# Patient Record
Sex: Male | Born: 1940 | Race: White | Hispanic: No | Marital: Single | Smoking: Former smoker
Health system: Southern US, Community
[De-identification: ages and names within clinical notes are randomized; demographics above are authoritative.]

## PROBLEM LIST (undated history)

## (undated) DIAGNOSIS — E119 Type 2 diabetes mellitus without complications: Secondary | ICD-10-CM

## (undated) DIAGNOSIS — I4891 Unspecified atrial fibrillation: Secondary | ICD-10-CM

## (undated) DIAGNOSIS — G5600 Carpal tunnel syndrome, unspecified upper limb: Secondary | ICD-10-CM

## (undated) DIAGNOSIS — E782 Mixed hyperlipidemia: Secondary | ICD-10-CM

## (undated) DIAGNOSIS — I2510 Atherosclerotic heart disease of native coronary artery without angina pectoris: Secondary | ICD-10-CM

## (undated) DIAGNOSIS — I10 Essential (primary) hypertension: Secondary | ICD-10-CM

## (undated) DIAGNOSIS — I255 Ischemic cardiomyopathy: Secondary | ICD-10-CM

## (undated) HISTORY — DX: Atherosclerotic heart disease of native coronary artery without angina pectoris: I25.10

## (undated) HISTORY — DX: Unspecified atrial fibrillation: I48.91

## (undated) HISTORY — DX: Mixed hyperlipidemia: E78.2

## (undated) HISTORY — DX: Essential (primary) hypertension: I10

## (undated) HISTORY — DX: Carpal tunnel syndrome, unspecified upper limb: G56.00

## (undated) HISTORY — DX: Ischemic cardiomyopathy: I25.5

## (undated) HISTORY — DX: Type 2 diabetes mellitus without complications: E11.9

---

## 2014-08-04 HISTORY — PX: CHOLECYSTECTOMY: SHX55

## 2014-08-31 ENCOUNTER — Encounter: Payer: Self-pay | Admitting: Cardiology

## 2014-08-31 ENCOUNTER — Encounter: Payer: Self-pay | Admitting: *Deleted

## 2014-08-31 ENCOUNTER — Ambulatory Visit (INDEPENDENT_AMBULATORY_CARE_PROVIDER_SITE_OTHER): Payer: Medicare HMO | Admitting: Cardiology

## 2014-08-31 VITALS — BP 163/70 | HR 63 | Ht 68.0 in | Wt 176.8 lb

## 2014-08-31 DIAGNOSIS — I481 Persistent atrial fibrillation: Secondary | ICD-10-CM

## 2014-08-31 DIAGNOSIS — I4819 Other persistent atrial fibrillation: Secondary | ICD-10-CM

## 2014-08-31 DIAGNOSIS — I10 Essential (primary) hypertension: Secondary | ICD-10-CM | POA: Diagnosis not present

## 2014-08-31 DIAGNOSIS — E119 Type 2 diabetes mellitus without complications: Secondary | ICD-10-CM | POA: Diagnosis not present

## 2014-08-31 DIAGNOSIS — I4891 Unspecified atrial fibrillation: Secondary | ICD-10-CM

## 2014-08-31 LAB — PROTIME-INR

## 2014-08-31 MED ORDER — ASPIRIN 325 MG PO TABS: 325.0000 mg | ORAL_TABLET | Freq: Every day | ORAL | Status: DC

## 2014-08-31 NOTE — Patient Instructions (Addendum)
Your physician has recommended you make the following change in your medication:  Decrease your aspirin 325 mg to daily. Continue all other medications the same. Your physician recommends that you have lab work to check your CMET, CBC, PTT, PT/INR.  Your physician has requested that you have an echocardiogram. Echocardiography is a painless test that uses sound waves to create images of your heart. It provides your doctor with information about the size and shape of your heart and how well your heart's chambers and valves are working. This procedure takes approximately one hour. There are no restrictions for this procedure. Your physician recommends that you schedule a follow-up appointment in: 1 month.

## 2014-08-31 NOTE — Progress Notes (Signed)
Cardiology Office Note  Date: 08/31/2014   ID: ERCOLE Sullivan, DOB Apr 04, 1940, MRN [id number]  PCP: Glenda Chroman., MD  Consulting Cardiologist: Rozann Lesches, MD   Chief Complaint  Patient presents with  . Atrial Fibrillation    History of Present Illness: Patrick Sullivan is a 74 y.o. male referred for cardiology consultation by Dr. Woody Seller. Limited records indicate evaluation in July for cholecystectomy, noted to be in atrial fibrillation by preoperative ECG. No reported cardiac complications with procedure, now referred to discuss management of atrial fibrillation.  He is here today, accompanied for his visit. He does not endorse any chest pain, limiting shortness of breath, or sense of palpitations. Reportedly, prior ECG from 2009 showed sinus rhythm, but no interval ECGs are available for comparison. Duration of atrial fibrillation is uncertain.  CHADSVASC score is 4, annual stroke risk of 5.1% on aspirin versus 1.7% on Eliquis.we discussed this in detail today. He has been taking aspirin 325 mg, 2 tablets each day for several years. Does report easy bleeding/bruising. No history of GI bleeding except reportedly years ago when he was drinking alcohol heavily back in the 1980s.  Today we discussed rationale for anticoagulation for stroke prophylaxis. He did not want to make a decision about this as yet. Also discussed obtaining an echocardiogram to assess cardiac structure and function, as well as lab work to assess renal function, hepatic function, and blood counts.  Past Medical History  Diagnosis Date  . Mixed hyperlipidemia   . Essential hypertension   . Type 2 diabetes mellitus     Past Surgical History  Procedure Laterality Date  . Cholecystectomy  08/04/2014    Current Outpatient Prescriptions  Medication Sig Dispense Refill  . amLODipine (NORVASC) 5 MG tablet Take 5 mg by mouth daily.    Marland Kitchen aspirin 325 MG tablet Take 1 tablet (325 mg total) by mouth daily. 30  tablet 3  . Blood Glucose Monitoring Suppl ([place]) KIT by Does not apply route.    . Garlic 8675 MG TABS Take 1 tablet by mouth daily.    Marland Kitchen gemfibrozil (LOPID) 600 MG tablet Take 600 mg by mouth 2 (two) times daily before a meal.    . GLIPIZIDE ER PO Take 5 mg by mouth daily. noon    . Insulin Pen Needle 32G X 6 MM MISC by Does not apply route.    . lovastatin (MEVACOR) 20 MG tablet Take 20 mg by mouth every evening.     . Multiple Vitamin (MULTIVITAMIN) tablet Take 0.5 tablets by mouth 2 (two) times daily.     . pantoprazole (PROTONIX) 40 MG tablet Take 40 mg by mouth daily.    . saw palmetto 500 MG capsule Take 100 mg by mouth daily.     . sitaGLIPtin-metformin (JANUMET) 50-1000 MG per tablet Take 1 tablet by mouth 2 (two) times daily with a meal.     No current facility-administered medications for this visit.    Allergies:  Review of patient's allergies indicates no known allergies.   Social History: The patient  reports that he quit smoking about 30 years ago. His smoking use included Cigarettes. He does not have any smokeless tobacco history on file. He reports that he does not drink alcohol or use illicit drugs.   Family History: The patient's family history includes Bone cancer in his son; Heart attack in his father.   ROS:  Please see the history of present illness. Otherwise, complete review of systems  is positive for chronic back pain.  All other systems are reviewed and negative.   Physical Exam: VS:  BP 163/70 mmHg  Pulse 63  Ht 5' 8" (1.727 m)  Wt 176 lb 12.8 oz (80.196 kg)  BMI 26.89 kg/m2  SpO2 100%, BMI Body mass index is 26.89 kg/(m^2).  Wt Readings from Last 3 Encounters:  08/31/14 176 lb 12.8 oz (80.196 kg)  08/17/14 181 lb (82.101 kg)     General: Patient appears comfortable at rest. HEENT: Conjunctiva and lids normal, oropharynx clear. Neck: Supple, no elevated JVP or carotid bruits, no thyromegaly. Lungs: Clear to auscultation, nonlabored  breathing at rest. Cardiac: Irregularly irregular, no S3 or significant systolic murmur, no pericardial rub. Abdomen: Soft, nontender, healing laparoscopy incisions, bowel sounds present, no guarding or rebound. Extremities: No pitting edema, distal pulses 2+. Skin: Warm and dry. Musculoskeletal: No kyphosis. Neuropsychiatric: Alert and oriented x3, affect grossly appropriate.   ECG: Tracing from 08/17/2014 done at [facility] showed atrial fibrillation at 68 bpm. Repeat tracing today confirms atrial fibrillation at 63 bpm.   Assessment and Plan:  1. Persistent atrial fibrillation of uncertain duration, asymptomatic in terms of palpitations, not requiring any specific rate control medication. CHADSVASC score is 4, we did discuss anticoagulation for stroke prophylaxis, however he did not want to make any decisions about this as yet. He will continue aspirin, I did ask him to reduce the dose to 81 mg daily. Echocardiogram will be obtained to assess cardiac structure and function, we will also review lab work.  2. Essential hypertension, on Norvasc, followed by Dr. Woody Seller.  3. Type 2 diabetes mellitus, followed by Dr. Woody Seller.  Current medicines were reviewed with the patient today.   Orders Placed This Encounter  Procedures  . Protime-INR  . APTT  . Comprehensive metabolic panel  . CBC  . EKG 12-Lead    Disposition: FU with me in 1 month.   Signed, Satira Sark, MD, [facility] 08/31/2014 3:10 PM    [facility] [place], [place], [place] [address] Phone: [phone]; Fax: [phone]

## 2014-09-03 ENCOUNTER — Telehealth: Payer: Self-pay | Admitting: *Deleted

## 2014-09-03 NOTE — Telephone Encounter (Signed)
-----   Message from Albertine Patricia, CMA sent at 09/01/2014  1:04 PM EDT -----   ----- Message -----    From: Jonelle Sidle, MD    Sent: 09/01/2014  12:09 PM      To: Albertine Patricia, CMA  Reviewed. Creatinine normal at 1.0, potassium 5.3, normal LFTs, normal INR, hemoglobin 10.9, platelets 183. Can discuss further office follow-up regarding potential medication changes.

## 2014-09-03 NOTE — Telephone Encounter (Signed)
Returned call and gave new phone number for patient. [phone].

## 2014-09-08 ENCOUNTER — Encounter: Payer: Self-pay | Admitting: *Deleted

## 2014-09-08 NOTE — Telephone Encounter (Signed)
-----   Message from Staci T Ashworth, CMA sent at 09/01/2014  1:04 PM EDT -----   ----- Message -----    From: Samuel G McDowell, MD    Sent: 09/01/2014  12:09 PM      To: Staci T Ashworth, CMA  Reviewed. Creatinine normal at 1.0, potassium 5.3, normal LFTs, normal INR, hemoglobin 10.9, platelets 183. Can discuss further office follow-up regarding potential medication changes. 

## 2014-09-08 NOTE — Telephone Encounter (Signed)
Results mailed to patient;.

## 2014-09-17 ENCOUNTER — Other Ambulatory Visit: Payer: Self-pay

## 2014-09-17 ENCOUNTER — Ambulatory Visit (INDEPENDENT_AMBULATORY_CARE_PROVIDER_SITE_OTHER): Payer: Medicare HMO

## 2014-09-17 DIAGNOSIS — I481 Persistent atrial fibrillation: Secondary | ICD-10-CM | POA: Diagnosis not present

## 2014-09-17 DIAGNOSIS — I4819 Other persistent atrial fibrillation: Secondary | ICD-10-CM

## 2014-09-23 ENCOUNTER — Telehealth: Payer: Self-pay | Admitting: *Deleted

## 2014-09-23 NOTE — Telephone Encounter (Signed)
Patient informed. 

## 2014-09-23 NOTE — Telephone Encounter (Signed)
-----   Message from Jonelle Sidle, MD sent at 09/18/2014 10:46 AM EDT ----- Reviewed report. LVEF normal range at 60-65%, mild mitral regurgitation, mild to moderate left atrial enlargement. No change in plan for now, will discuss further office follow-up.

## 2014-10-01 ENCOUNTER — Ambulatory Visit (INDEPENDENT_AMBULATORY_CARE_PROVIDER_SITE_OTHER): Payer: Medicare HMO | Admitting: Cardiology

## 2014-10-01 ENCOUNTER — Encounter: Payer: Self-pay | Admitting: Cardiology

## 2014-10-01 VITALS — BP 146/73 | HR 71 | Ht 68.0 in | Wt 176.8 lb

## 2014-10-01 DIAGNOSIS — I10 Essential (primary) hypertension: Secondary | ICD-10-CM | POA: Diagnosis not present

## 2014-10-01 DIAGNOSIS — I481 Persistent atrial fibrillation: Secondary | ICD-10-CM

## 2014-10-01 DIAGNOSIS — I4819 Other persistent atrial fibrillation: Secondary | ICD-10-CM

## 2014-10-01 NOTE — Progress Notes (Signed)
Cardiology Office Note  Date: 10/01/2014   ID: Patrick Sullivan, DOB Nov 15, 1940, MRN [id number]  PCP: Patrick Sullivan., MD  Primary Cardiologist: Patrick Lesches, MD   Chief Complaint  Patient presents with  . Persistent atrial fibrillation    History of Present Illness: Patrick Sullivan is a 74 y.o. male seen recently in consultation back in early August with persistent atrial fibrillation of uncertain duration. He comes in today for a follow-up visit. We reviewed the results of his interval echocardiogram, outlined below. He states that he has considered the possibility of anticoagulation further, and declines at this point. He would prefer to stay on aspirin at this time. I reminded him that with CHADSVASC score is 4, his annual stroke risk is approximately 5.1% on aspirin versus 1.7% on Eliquis.  He states he remains active, experiences no palpitations or chest pain. Does chores indoors and outdoors.  Past Medical History  Diagnosis Date  . Mixed hyperlipidemia   . Essential hypertension   . Type 2 diabetes mellitus     Current Outpatient Prescriptions  Medication Sig Dispense Refill  . amLODipine (NORVASC) 5 MG tablet Take 5 mg by mouth daily.    Marland Kitchen aspirin 325 MG tablet Take 1 tablet (325 mg total) by mouth daily. 30 tablet 3  . Blood Glucose Monitoring Suppl ([place]) KIT by Does not apply route.    . Garlic 7408 MG TABS Take 1 tablet by mouth daily.    Marland Kitchen gemfibrozil (LOPID) 600 MG tablet Take 600 mg by mouth 2 (two) times daily before a meal.    . GLIPIZIDE ER PO Take 5 mg by mouth daily. noon    . Insulin Pen Needle 32G X 6 MM MISC by Does not apply route.    . lovastatin (MEVACOR) 20 MG tablet Take 20 mg by mouth every evening.     . Multiple Vitamin (MULTIVITAMIN) tablet Take 0.5 tablets by mouth 2 (two) times daily.     . pantoprazole (PROTONIX) 40 MG tablet Take 40 mg by mouth daily.    . saw palmetto 500 MG capsule Take 100 mg by mouth daily.     .  sitaGLIPtin-metformin (JANUMET) 50-1000 MG per tablet Take 1 tablet by mouth 2 (two) times daily with a meal.     No current facility-administered medications for this visit.    Allergies:  Review of patient's allergies indicates no known allergies.   Social History: The patient  reports that he quit smoking about 30 years ago. His smoking use included Cigarettes. He does not have any smokeless tobacco history on file. He reports that he does not drink alcohol or use illicit drugs.   ROS:  Please see the history of present illness. Otherwise, complete review of systems is positive for none.  All other systems are reviewed and negative.   Physical Exam: VS:  BP 146/73 mmHg  Pulse 71  Ht _0  (1.727 m)  Wt 176 lb 12.8 oz (80.196 kg)  BMI 26.89 kg/m2  SpO2 98%, BMI Body mass index is 26.89 kg/(m^2).  Wt Readings from Last 3 Encounters:  10/01/14 176 lb 12.8 oz (80.196 kg)  08/31/14 176 lb 12.8 oz (80.196 kg)  08/17/14 181 lb (82.101 kg)     General: Patient appears comfortable at rest. HEENT: Conjunctiva and lids normal, oropharynx clear with moist mucosa. Neck: Supple, no elevated JVP or carotid bruits, no thyromegaly. Lungs: Clear to auscultation, nonlabored breathing at rest. Cardiac: Regular rate and rhythm,  no S3 or significant systolic murmur, no pericardial rub. Abdomen: Soft, nontender, no hepatomegaly, bowel sounds present, no guarding or rebound. Extremities: No pitting edema, distal pulses 2+.   ECG: ECG is not ordered today.   Recent Labwork:  08/31/2014: BUN 26, creatinine 1.0, potassium 5.3, INR 1.1, hemoglobin 10.9, platelet 183.  Other Studies Reviewed Today:  Echocardiogram 09/17/2014: Study Conclusions  - Left ventricle: The cavity size was normal. Wall thickness was normal. Systolic function was normal. The estimated ejection fraction was in the range of 60% to 65%. Wall motion was normal; there were no regional wall motion abnormalities. The study  was not technically sufficient to allow evaluation of LV diastolic dysfunction due to atrial fibrillation. - Aortic valve: Mildly to moderately calcified annulus. Mildly calcified leaflets. - Mitral valve: There was mild regurgitation. - Left atrium: The atrium was mildly to moderately dilated.  Assessment and Plan:  1. Persistent atrial fibrillation as outlined above. We have had discussion about anticoagulation for stroke prophylaxis, and he declines. Continues on aspirin daily, no rate lowering medications at this point.  2. Essential hypertension, on Norvasc, keep follow-up with Patrick Sullivan.  Current medicines were reviewed with the patient today.  Disposition: FU with me in 6 months.   Signed, Patrick Sark, MD, Patrick Sullivan 10/01/2014 1:24 PM    [facility] [place], [place], [place] [address] Phone: [phone]; Fax: [phone]

## 2014-10-01 NOTE — Patient Instructions (Signed)
Your physician recommends that you continue on your current medications as directed. Please refer to the Current Medication list given to you today. Your physician recommends that you schedule a follow-up appointment in: 6 months. You will receive a reminder letter in the mail in about 4 months reminding you to call and schedule your appointment. If you don't receive this letter, please contact our office. 

## 2015-02-03 ENCOUNTER — Other Ambulatory Visit: Payer: Self-pay | Admitting: Cardiology

## 2015-02-03 ENCOUNTER — Ambulatory Visit (INDEPENDENT_AMBULATORY_CARE_PROVIDER_SITE_OTHER): Payer: Medicare HMO | Admitting: Cardiology

## 2015-02-03 ENCOUNTER — Encounter: Payer: Self-pay | Admitting: Cardiology

## 2015-02-03 VITALS — BP 150/66 | HR 82 | Ht 68.0 in | Wt 183.0 lb

## 2015-02-03 DIAGNOSIS — I25709 Atherosclerosis of coronary artery bypass graft(s), unspecified, with unspecified angina pectoris: Secondary | ICD-10-CM

## 2015-02-03 DIAGNOSIS — R778 Other specified abnormalities of plasma proteins: Secondary | ICD-10-CM

## 2015-02-03 DIAGNOSIS — I481 Persistent atrial fibrillation: Secondary | ICD-10-CM

## 2015-02-03 DIAGNOSIS — E119 Type 2 diabetes mellitus without complications: Secondary | ICD-10-CM

## 2015-02-03 DIAGNOSIS — Z01811 Encounter for preprocedural respiratory examination: Secondary | ICD-10-CM | POA: Diagnosis not present

## 2015-02-03 DIAGNOSIS — I10 Essential (primary) hypertension: Secondary | ICD-10-CM

## 2015-02-03 DIAGNOSIS — I4819 Other persistent atrial fibrillation: Secondary | ICD-10-CM

## 2015-02-03 DIAGNOSIS — R0602 Shortness of breath: Secondary | ICD-10-CM | POA: Diagnosis not present

## 2015-02-03 DIAGNOSIS — R7989 Other specified abnormal findings of blood chemistry: Secondary | ICD-10-CM

## 2015-02-03 NOTE — Addendum Note (Signed)
Addended by: Marlyn CorporalARLTON, CATHERINE A on: 02/03/2015 10:11 AM   Modules accepted: Orders

## 2015-02-03 NOTE — Progress Notes (Signed)
Cardiology Office Note  Date: 02/03/2015   ID: Patrick Sullivan, DOB 22-Sep-1940, MRN [id number]  PCP: Glenda Chroman., MD  Primary Cardiologist: Rozann Lesches, MD   Chief Complaint  Patient presents with  . Hospitalization Follow-up    History of Present Illness: Patrick Sullivan is a 75 y.o. male last seen in September at which time he was clinically stable with persistent atrial fibrillation and declined anticoagulation for stroke prophylaxis. Interval records reviewed from [facility] regarding recent hospital stay. He was admitted by Dr. Woody Seller on December 29 for further evaluation of reported exertional shortness of breath, orthopnea, feeling of chest tightness when supine, also episode of falling down after standing up when getting out of bed (possibly related to sleeping pill). He was seen in consultation by the [facility] cardiology practice (Dr. Jac Canavan). Lab work included a d-dimer of 2.06, NT-pro BNP 1646, troponin T levels of 0.11, 0.10, 0.12, and 0.14. CT angiogram of the chest reported small bilateral pleural effusions, but no evidence of pulmonary embolus. Coronary vascular calcification was described, but not quantified. Echocardiogram reported LVEF of 50-55%, mild left atrial enlargement, mild aortic stenosis, mild mitral regurgitation, mild tricuspid regurgitation, no pericardial effusion. ECG revealed rate-controlled atrial fibrillation with nonspecific ST segment abnormalities. Cardiac catheterization was recommended to the patient based on his workup at [facility], and he presents to see me today to discuss things further.  He presents today for follow-up, has not had any accelerating symptoms since recent hospital discharge. We reviewed the results of his recent evaluation in detail. We did discuss the risks and benefits of a diagnostic cardiac catheterization to more clearly evaluate his coronary anatomy in light of recent symptoms, somewhat equivocal troponin T levels in terms of  true ACS, and incidental findings of coronary atherosclerosis by chest CT imaging. He is in agreement to proceed.  Past Medical History  Diagnosis Date  . Mixed hyperlipidemia   . Essential hypertension   . Type 2 diabetes mellitus ([place])   . Atrial fibrillation [facility])     Past Surgical History  Procedure Laterality Date  . Cholecystectomy  08/04/2014    Current Outpatient Prescriptions  Medication Sig Dispense Refill  . amLODipine (NORVASC) 5 MG tablet Take 5 mg by mouth daily.    Marland Kitchen aspirin 325 MG tablet Take 1 tablet (325 mg total) by mouth daily. 30 tablet 3  . Blood Glucose Monitoring Suppl ([place]) KIT by Does not apply route.    . Garlic 0258 MG TABS Take 1 tablet by mouth daily.    Marland Kitchen gemfibrozil (LOPID) 600 MG tablet Take 600 mg by mouth 2 (two) times daily before a meal.    . GLIPIZIDE ER PO Take 5 mg by mouth daily. noon    . Insulin Pen Needle 32G X 6 MM MISC by Does not apply route.    . lovastatin (MEVACOR) 20 MG tablet Take 20 mg by mouth every evening.     . Multiple Vitamin (MULTIVITAMIN) tablet Take 0.5 tablets by mouth 2 (two) times daily.     . pantoprazole (PROTONIX) 40 MG tablet Take 40 mg by mouth daily.    . saw palmetto 500 MG capsule Take 100 mg by mouth daily.     . sitaGLIPtin-metformin (JANUMET) 50-1000 MG per tablet Take 1 tablet by mouth 2 (two) times daily with a meal.     No current facility-administered medications for this visit.   Allergies:  Review of patient's allergies indicates no known allergies.  Social History: The patient  reports that he quit smoking about 30 years ago. His smoking use included Cigarettes. He does not have any smokeless tobacco history on file. He reports that he does not drink alcohol or use illicit drugs.   ROS:  Please see the history of present illness. Otherwise, complete review of systems is positive for orthopnea no palpitations.  All other systems are reviewed and negative.   Physical Exam: VS:  BP  150/66 mmHg  Pulse 82  Ht _0  (1.727 m)  Wt 183 lb (83.008 kg)  BMI 27.83 kg/m2  SpO2 99%, BMI Body mass index is 27.83 kg/(m^2).  Wt Readings from Last 3 Encounters:  02/03/15 183 lb (83.008 kg)  10/01/14 176 lb 12.8 oz (80.196 kg)  08/31/14 176 lb 12.8 oz (80.196 kg)    General: Patient appears comfortable at rest. HEENT: Conjunctiva and lids normal, oropharynx clear with moist mucosa. Neck: Supple, no elevated JVP or carotid bruits, no thyromegaly. Lungs: Clear to auscultation, nonlabored breathing at rest. Cardiac: Irregular, no S3, 2/6 systolic murmur, no pericardial rub. Abdomen: Soft, nontender, bowel sounds present, no guarding or rebound. Extremities: No pitting edema, distal pulses 2+. Skin: Warm and dry. Musculoskeletal: No kyphosis. Neuropsychiatric: Alert and oriented 3, affect appropriate.  ECG: ECG is not ordered today.  Recent Labwork:  December 2016: BUN 14, creatinine 0.9, potassium 4.5, AST 48, ALT 103, WBC 10.5, platelets 265  Other Studies Reviewed Today:  Echocardiogram 09/17/2014: Study Conclusions  - Left ventricle: The cavity size was normal. Wall thickness was normal. Systolic function was normal. The estimated ejection fraction was in the range of 60% to 65%. Wall motion was normal; there were no regional wall motion abnormalities. The study was not technically sufficient to allow evaluation of LV diastolic dysfunction due to atrial fibrillation. - Aortic valve: Mildly to moderately calcified annulus. Mildly calcified leaflets. - Mitral valve: There was mild regurgitation. - Left atrium: The atrium was mildly to moderately dilated.  Assessment and Plan:  1. Recent symptoms including shortness of breath, orthopnea with intermittent chest tightness, and an unexplained fall. Onset was about 2 weeks ago. Recent evaluation in [place] showed somewhat equivocal troponin T levels for true ACS, LVEF 50-55% with mild aortic stenosis,  persistent atrial fibrillation with no significant RVR, no evidence of pulmonary embolus by chest CT, but incidental findings of coronary atherosclerosis. After discussing the risks and benefits of a diagnostic cardiac catheterization to more clearly evaluate his coronary anatomy, he is in agreement to proceed.  2. Persistent atrial fibrillation, patient has declined anticoagulation. We will continue to discuss this. His CHADSVASC score is 5 at this point.  3. Essential hypertension. Blood pressure has been more elevated recently. He reports compliance with his medications.  4. Type 2 diabetes mellitus, followed by Dr. Woody Seller.   5. Hyperlipidemia, on statin therapy.  Current medicines were reviewed with the patient today.   Disposition: FU with me after cardiac catheterization.   Signed, Satira Sark, MD, [facility] 02/03/2015 9:57 AM    [facility] [place]. [address] [place], [place] [address] Phone: [phone]; Fax: [phone]

## 2015-02-03 NOTE — Patient Instructions (Signed)
Your physician has requested that you have a cardiac catheterization. Cardiac catheterization is used to diagnose and/or treat various heart conditions. Doctors may recommend this procedure for a number of different reasons. The most common reason is to evaluate chest pain. Chest pain can be a symptom of coronary artery disease (CAD), and cardiac catheterization can show whether plaque is narrowing or blocking your heart's arteries. This procedure is also used to evaluate the valves, as well as measure the blood flow and oxygen levels in different parts of your heart. For further information please visit [email or web address] Please follow instruction sheet, as given.  HOLD JANUMET THE DAY BEFORE THE CATH,DAY OF CATH, AND 2 DAYS AFTER WORDS     TAKE HALF DOSE INSULIN NIGHT BEFORE CATH,HOLD THE DAY OF CATH    HOLD GLIPIZIDE THE MORNING OF TEST       Thank you for choosing [facility] !

## 2015-02-04 NOTE — H&P (View-Only) (Signed)
Cardiology Office Note  Date: 02/03/2015   ID: YORK VALLIANT, DOB 22-Sep-1940, MRN [id number]  PCP: Glenda Chroman., MD  Primary Cardiologist: Rozann Lesches, MD   Chief Complaint  Patient presents with  . Hospitalization Follow-up    History of Present Illness: Patrick Sullivan is a 75 y.o. male last seen in September at which time he was clinically stable with persistent atrial fibrillation and declined anticoagulation for stroke prophylaxis. Interval records reviewed from [facility] regarding recent hospital stay. He was admitted by Dr. Woody Seller on December 29 for further evaluation of reported exertional shortness of breath, orthopnea, feeling of chest tightness when supine, also episode of falling down after standing up when getting out of bed (possibly related to sleeping pill). He was seen in consultation by the [facility] cardiology practice (Dr. Jac Canavan). Lab work included a d-dimer of 2.06, NT-pro BNP 1646, troponin T levels of 0.11, 0.10, 0.12, and 0.14. CT angiogram of the chest reported small bilateral pleural effusions, but no evidence of pulmonary embolus. Coronary vascular calcification was described, but not quantified. Echocardiogram reported LVEF of 50-55%, mild left atrial enlargement, mild aortic stenosis, mild mitral regurgitation, mild tricuspid regurgitation, no pericardial effusion. ECG revealed rate-controlled atrial fibrillation with nonspecific ST segment abnormalities. Cardiac catheterization was recommended to the patient based on his workup at [facility], and he presents to see me today to discuss things further.  He presents today for follow-up, has not had any accelerating symptoms since recent hospital discharge. We reviewed the results of his recent evaluation in detail. We did discuss the risks and benefits of a diagnostic cardiac catheterization to more clearly evaluate his coronary anatomy in light of recent symptoms, somewhat equivocal troponin T levels in terms of  true ACS, and incidental findings of coronary atherosclerosis by chest CT imaging. He is in agreement to proceed.  Past Medical History  Diagnosis Date  . Mixed hyperlipidemia   . Essential hypertension   . Type 2 diabetes mellitus ([place])   . Atrial fibrillation [facility])     Past Surgical History  Procedure Laterality Date  . Cholecystectomy  08/04/2014    Current Outpatient Prescriptions  Medication Sig Dispense Refill  . amLODipine (NORVASC) 5 MG tablet Take 5 mg by mouth daily.    Marland Kitchen aspirin 325 MG tablet Take 1 tablet (325 mg total) by mouth daily. 30 tablet 3  . Blood Glucose Monitoring Suppl ([place]) KIT by Does not apply route.    . Garlic 0258 MG TABS Take 1 tablet by mouth daily.    Marland Kitchen gemfibrozil (LOPID) 600 MG tablet Take 600 mg by mouth 2 (two) times daily before a meal.    . GLIPIZIDE ER PO Take 5 mg by mouth daily. noon    . Insulin Pen Needle 32G X 6 MM MISC by Does not apply route.    . lovastatin (MEVACOR) 20 MG tablet Take 20 mg by mouth every evening.     . Multiple Vitamin (MULTIVITAMIN) tablet Take 0.5 tablets by mouth 2 (two) times daily.     . pantoprazole (PROTONIX) 40 MG tablet Take 40 mg by mouth daily.    . saw palmetto 500 MG capsule Take 100 mg by mouth daily.     . sitaGLIPtin-metformin (JANUMET) 50-1000 MG per tablet Take 1 tablet by mouth 2 (two) times daily with a meal.     No current facility-administered medications for this visit.   Allergies:  Review of patient's allergies indicates no known allergies.  Social History: The patient  reports that he quit smoking about 30 years ago. His smoking use included Cigarettes. He does not have any smokeless tobacco history on file. He reports that he does not drink alcohol or use illicit drugs.   ROS:  Please see the history of present illness. Otherwise, complete review of systems is positive for orthopnea no palpitations.  All other systems are reviewed and negative.   Physical Exam: VS:  BP  150/66 mmHg  Pulse 82  Ht _0  (1.727 m)  Wt 183 lb (83.008 kg)  BMI 27.83 kg/m2  SpO2 99%, BMI Body mass index is 27.83 kg/(m^2).  Wt Readings from Last 3 Encounters:  02/03/15 183 lb (83.008 kg)  10/01/14 176 lb 12.8 oz (80.196 kg)  08/31/14 176 lb 12.8 oz (80.196 kg)    General: Patient appears comfortable at rest. HEENT: Conjunctiva and lids normal, oropharynx clear with moist mucosa. Neck: Supple, no elevated JVP or carotid bruits, no thyromegaly. Lungs: Clear to auscultation, nonlabored breathing at rest. Cardiac: Irregular, no S3, 2/6 systolic murmur, no pericardial rub. Abdomen: Soft, nontender, bowel sounds present, no guarding or rebound. Extremities: No pitting edema, distal pulses 2+. Skin: Warm and dry. Musculoskeletal: No kyphosis. Neuropsychiatric: Alert and oriented 3, affect appropriate.  ECG: ECG is not ordered today.  Recent Labwork:  December 2016: BUN 14, creatinine 0.9, potassium 4.5, AST 48, ALT 103, WBC 10.5, platelets 265  Other Studies Reviewed Today:  Echocardiogram 09/17/2014: Study Conclusions  - Left ventricle: The cavity size was normal. Wall thickness was normal. Systolic function was normal. The estimated ejection fraction was in the range of 60% to 65%. Wall motion was normal; there were no regional wall motion abnormalities. The study was not technically sufficient to allow evaluation of LV diastolic dysfunction due to atrial fibrillation. - Aortic valve: Mildly to moderately calcified annulus. Mildly calcified leaflets. - Mitral valve: There was mild regurgitation. - Left atrium: The atrium was mildly to moderately dilated.  Assessment and Plan:  1. Recent symptoms including shortness of breath, orthopnea with intermittent chest tightness, and an unexplained fall. Onset was about 2 weeks ago. Recent evaluation in [place] showed somewhat equivocal troponin T levels for true ACS, LVEF 50-55% with mild aortic stenosis,  persistent atrial fibrillation with no significant RVR, no evidence of pulmonary embolus by chest CT, but incidental findings of coronary atherosclerosis. After discussing the risks and benefits of a diagnostic cardiac catheterization to more clearly evaluate his coronary anatomy, he is in agreement to proceed.  2. Persistent atrial fibrillation, patient has declined anticoagulation. We will continue to discuss this. His CHADSVASC score is 5 at this point.  3. Essential hypertension. Blood pressure has been more elevated recently. He reports compliance with his medications.  4. Type 2 diabetes mellitus, followed by Dr. Woody Seller.   5. Hyperlipidemia, on statin therapy.  Current medicines were reviewed with the patient today.   Disposition: FU with me after cardiac catheterization.   Signed, Satira Sark, MD, [facility] 02/03/2015 9:57 AM    [place] [facility] [place]. [address] [place], [place] [address] Phone: [phone]; Fax: [phone]

## 2015-02-04 NOTE — Interval H&P Note (Signed)
Cath Lab Visit (complete for each Cath Lab visit)  Clinical Evaluation Leading to the Procedure:   ACS: No.  Non-ACS:    Anginal Classification: CCS III  Anti-ischemic medical therapy: Maximal Therapy (2 or more classes of medications)  Non-Invasive Test Results: No non-invasive testing performed  Prior CABG: No previous CABG      History and Physical Interval Note:  02/04/2015 7:35 PM  Patrick Sullivan  has presented today for surgery, with the diagnosis of non stemi  The various methods of treatment have been discussed with the patient and family. After consideration of risks, benefits and other options for treatment, the patient has consented to  Procedure(s): Left Heart Cath and Coronary Angiography (N/A) as a surgical intervention .  The patient's history has been reviewed, patient examined, no change in status, stable for surgery.  I have reviewed the patient's chart and labs.  Questions were answered to the patient's satisfaction.     Lesleigh NoeSMITH III,Shams Fill W

## 2015-02-05 ENCOUNTER — Inpatient Hospital Stay (HOSPITAL_COMMUNITY)
Admission: AD | Admit: 2015-02-05 | Discharge: 2015-02-05 | DRG: 281 | Disposition: A | Discharge status: Home / Self Care | Payer: Medicare HMO | Source: Ambulatory Visit | Attending: Interventional Cardiology | Admitting: Interventional Cardiology

## 2015-02-05 ENCOUNTER — Other Ambulatory Visit: Payer: Self-pay | Admitting: *Deleted

## 2015-02-05 ENCOUNTER — Encounter (HOSPITAL_COMMUNITY)
Admission: AD | Disposition: A | Discharge status: Home / Self Care | Payer: Self-pay | Source: Ambulatory Visit | Attending: Interventional Cardiology

## 2015-02-05 ENCOUNTER — Encounter (HOSPITAL_COMMUNITY): Payer: Self-pay | Admitting: Interventional Cardiology

## 2015-02-05 DIAGNOSIS — Z87891 Personal history of nicotine dependence: Secondary | ICD-10-CM | POA: Diagnosis not present

## 2015-02-05 DIAGNOSIS — I2510 Atherosclerotic heart disease of native coronary artery without angina pectoris: Secondary | ICD-10-CM | POA: Diagnosis present

## 2015-02-05 DIAGNOSIS — I5032 Chronic diastolic (congestive) heart failure: Secondary | ICD-10-CM | POA: Diagnosis present

## 2015-02-05 DIAGNOSIS — I481 Persistent atrial fibrillation: Secondary | ICD-10-CM | POA: Diagnosis present

## 2015-02-05 DIAGNOSIS — Z794 Long term (current) use of insulin: Secondary | ICD-10-CM | POA: Diagnosis not present

## 2015-02-05 DIAGNOSIS — I214 Non-ST elevation (NSTEMI) myocardial infarction: Secondary | ICD-10-CM | POA: Diagnosis present

## 2015-02-05 DIAGNOSIS — E782 Mixed hyperlipidemia: Secondary | ICD-10-CM | POA: Diagnosis present

## 2015-02-05 DIAGNOSIS — E119 Type 2 diabetes mellitus without complications: Secondary | ICD-10-CM | POA: Diagnosis present

## 2015-02-05 DIAGNOSIS — I208 Other forms of angina pectoris: Secondary | ICD-10-CM

## 2015-02-05 DIAGNOSIS — R7989 Other specified abnormal findings of blood chemistry: Secondary | ICD-10-CM

## 2015-02-05 DIAGNOSIS — I10 Essential (primary) hypertension: Secondary | ICD-10-CM | POA: Diagnosis present

## 2015-02-05 DIAGNOSIS — I25709 Atherosclerosis of coronary artery bypass graft(s), unspecified, with unspecified angina pectoris: Secondary | ICD-10-CM | POA: Insufficient documentation

## 2015-02-05 DIAGNOSIS — R778 Other specified abnormalities of plasma proteins: Secondary | ICD-10-CM

## 2015-02-05 HISTORY — PX: CARDIAC CATHETERIZATION: SHX172

## 2015-02-05 LAB — BASIC METABOLIC PANEL
Anion gap: 8 (ref 5–15)
BUN: 14 mg/dL (ref 6–20)
CHLORIDE: 103 mmol/L (ref 101–111)
CO2: 27 mmol/L (ref 22–32)
Calcium: 9.3 mg/dL (ref 8.9–10.3)
Creatinine, Ser: 1.07 mg/dL (ref 0.61–1.24)
GFR calc Af Amer: >60 mL/min (ref >60)
GFR calc non Af Amer: >60 mL/min (ref >60)
GLUCOSE: 172 mg/dL — AB (ref 65–99)
POTASSIUM: 4.3 mmol/L (ref 3.5–5.1)
SODIUM: 138 mmol/L (ref 135–145)

## 2015-02-05 LAB — GLUCOSE, CAPILLARY
GLUCOSE-CAPILLARY: 168 mg/dL — AB (ref 65–99)
Glucose-Capillary: 170 mg/dL — ABNORMAL HIGH (ref 65–99)
Glucose-Capillary: 237 mg/dL — ABNORMAL HIGH (ref 65–99)
Glucose-Capillary: 214 mg/dL — ABNORMAL HIGH (ref 65–99)

## 2015-02-05 LAB — CBC
HEMATOCRIT: 31.9 % — AB (ref 39.0–52.0)
Hemoglobin: 10.4 g/dL — ABNORMAL LOW (ref 13.0–17.0)
MCH: 30.7 pg (ref 26.0–34.0)
MCHC: 32.6 g/dL (ref 30.0–36.0)
MCV: 94.1 fL (ref 78.0–100.0)
Platelets: 216 10*3/uL (ref 150–400)
RBC: 3.39 MIL/uL — ABNORMAL LOW (ref 4.22–5.81)
RDW: 13.4 % (ref 11.5–15.5)
WBC: 7.6 10*3/uL (ref 4.0–10.5)

## 2015-02-05 LAB — PROTIME-INR
INR: 1.29 (ref 0.00–1.49)
Prothrombin Time: 16.2 seconds — ABNORMAL HIGH (ref 11.6–15.2)

## 2015-02-05 SURGERY — LEFT HEART CATH AND CORONARY ANGIOGRAPHY

## 2015-02-05 MED ORDER — MIDAZOLAM HCL 2 MG/2ML IJ SOLN
INTRAMUSCULAR | Status: AC
  Filled 2015-02-05: qty 2

## 2015-02-05 MED ORDER — HEPARIN (PORCINE) IN NACL 2-0.9 UNIT/ML-% IJ SOLN
INTRAMUSCULAR | Status: AC
  Filled 2015-02-05: qty 1000

## 2015-02-05 MED ORDER — SODIUM CHLORIDE 0.9 % IJ SOLN: 3.0000 mL | Freq: Two times a day (BID) | INTRAMUSCULAR | Status: DC

## 2015-02-05 MED ORDER — ASPIRIN 81 MG PO CHEW
81.0000 mg | CHEWABLE_TABLET | ORAL | Status: AC
  Administered 2015-02-05: 81 mg via ORAL

## 2015-02-05 MED ORDER — GEMFIBROZIL 600 MG PO TABS
600.0000 mg | ORAL_TABLET | Freq: Two times a day (BID) | ORAL | Status: DC
  Administered 2015-02-05: 600 mg via ORAL
  Filled 2015-02-05 (×2): qty 1

## 2015-02-05 MED ORDER — SODIUM CHLORIDE 0.9 % IJ SOLN
3.0000 mL | Freq: Two times a day (BID) | INTRAMUSCULAR | Status: DC
  Administered 2015-02-05: 3 mL via INTRAVENOUS

## 2015-02-05 MED ORDER — VERAPAMIL HCL 2.5 MG/ML IV SOLN
INTRAVENOUS | Status: AC
  Filled 2015-02-05: qty 2

## 2015-02-05 MED ORDER — LINAGLIPTIN 5 MG PO TABS
5.0000 mg | ORAL_TABLET | Freq: Two times a day (BID) | ORAL | Status: DC
  Administered 2015-02-05: 5 mg via ORAL
  Filled 2015-02-05: qty 1

## 2015-02-05 MED ORDER — IOHEXOL 350 MG/ML SOLN
INTRAVENOUS | Status: DC | PRN
  Administered 2015-02-05: 85 mL via INTRA_ARTERIAL

## 2015-02-05 MED ORDER — ACETAMINOPHEN 325 MG PO TABS: 650.0000 mg | ORAL_TABLET | ORAL | Status: DC | PRN

## 2015-02-05 MED ORDER — ASPIRIN 81 MG PO CHEW: 81.0000 mg | CHEWABLE_TABLET | Freq: Every day | ORAL | Status: DC

## 2015-02-05 MED ORDER — GLIPIZIDE ER 5 MG PO TB24
5.0000 mg | ORAL_TABLET | Freq: Every day | ORAL | Status: DC
  Filled 2015-02-05: qty 1

## 2015-02-05 MED ORDER — LIDOCAINE HCL (PF) 1 % IJ SOLN
INTRAMUSCULAR | Status: AC
  Filled 2015-02-05: qty 30

## 2015-02-05 MED ORDER — METFORMIN HCL 500 MG PO TABS
1000.0000 mg | ORAL_TABLET | Freq: Two times a day (BID) | ORAL | Status: DC
  Filled 2015-02-05: qty 2

## 2015-02-05 MED ORDER — SODIUM CHLORIDE 0.9 % IV SOLN: 250.0000 mL | INTRAVENOUS | Status: DC | PRN

## 2015-02-05 MED ORDER — HEPARIN (PORCINE) IN NACL 100-0.45 UNIT/ML-% IJ SOLN
1150.0000 [IU]/h | INTRAMUSCULAR | Status: DC
  Administered 2015-02-05: 1150 [IU]/h via INTRAVENOUS
  Filled 2015-02-05: qty 250

## 2015-02-05 MED ORDER — PANTOPRAZOLE SODIUM 40 MG PO TBEC
40.0000 mg | DELAYED_RELEASE_TABLET | Freq: Every day | ORAL | Status: DC
  Administered 2015-02-05: 40 mg via ORAL
  Filled 2015-02-05: qty 1

## 2015-02-05 MED ORDER — NITROGLYCERIN 0.4 MG SL SUBL
0.4000 mg | SUBLINGUAL_TABLET | SUBLINGUAL | Status: DC | PRN
Start: 2015-02-05 — End: 2015-02-05

## 2015-02-05 MED ORDER — HEPARIN SODIUM (PORCINE) 1000 UNIT/ML IJ SOLN
INTRAMUSCULAR | Status: DC | PRN
  Administered 2015-02-05: 4000 [IU] via INTRAVENOUS

## 2015-02-05 MED ORDER — NITROGLYCERIN 0.4 MG SL SUBL: 0.4000 mg | SUBLINGUAL_TABLET | SUBLINGUAL | Status: AC | PRN

## 2015-02-05 MED ORDER — FENTANYL CITRATE (PF) 100 MCG/2ML IJ SOLN
INTRAMUSCULAR | Status: AC
  Filled 2015-02-05: qty 2

## 2015-02-05 MED ORDER — SODIUM CHLORIDE 0.9 % IV SOLN
INTRAVENOUS | Status: DC
  Administered 2015-02-05: 07:00:00 via INTRAVENOUS

## 2015-02-05 MED ORDER — SODIUM CHLORIDE 0.9 % IJ SOLN: 3.0000 mL | INTRAMUSCULAR | Status: DC | PRN

## 2015-02-05 MED ORDER — SITAGLIPTIN PHOS-METFORMIN HCL 50-1000 MG PO TABS: 1.0000 | ORAL_TABLET | Freq: Two times a day (BID) | ORAL | Status: DC

## 2015-02-05 MED ORDER — SODIUM CHLORIDE 0.9 % WEIGHT BASED INFUSION: 1.0000 mL/kg/h | INTRAVENOUS | Status: AC

## 2015-02-05 MED ORDER — PRAVASTATIN SODIUM 40 MG PO TABS
40.0000 mg | ORAL_TABLET | Freq: Every day | ORAL | Status: DC
  Administered 2015-02-05: 40 mg via ORAL
  Filled 2015-02-05: qty 1

## 2015-02-05 MED ORDER — AMLODIPINE BESYLATE 5 MG PO TABS: 5.0000 mg | ORAL_TABLET | Freq: Every day | ORAL | Status: DC

## 2015-02-05 MED ORDER — ASPIRIN 81 MG PO CHEW
CHEWABLE_TABLET | ORAL | Status: AC
  Administered 2015-02-05: 81 mg via ORAL
  Filled 2015-02-05: qty 1

## 2015-02-05 MED ORDER — HEPARIN (PORCINE) IN NACL 2-0.9 UNIT/ML-% IJ SOLN
INTRAMUSCULAR | Status: DC | PRN
  Administered 2015-02-05: 08:00:00

## 2015-02-05 MED ORDER — ONDANSETRON HCL 4 MG/2ML IJ SOLN: 4.0000 mg | Freq: Four times a day (QID) | INTRAMUSCULAR | Status: DC | PRN

## 2015-02-05 MED ORDER — MIDAZOLAM HCL 2 MG/2ML IJ SOLN
INTRAMUSCULAR | Status: DC | PRN
  Administered 2015-02-05: 1 mg via INTRAVENOUS

## 2015-02-05 MED ORDER — VERAPAMIL HCL 2.5 MG/ML IV SOLN
INTRAVENOUS | Status: DC | PRN
  Administered 2015-02-05: 08:00:00 via INTRA_ARTERIAL

## 2015-02-05 MED ORDER — AMLODIPINE BESYLATE 5 MG PO TABS
5.0000 mg | ORAL_TABLET | Freq: Every day | ORAL | Status: DC
  Administered 2015-02-05: 5 mg via ORAL
  Filled 2015-02-05: qty 1

## 2015-02-05 MED ORDER — FENTANYL CITRATE (PF) 100 MCG/2ML IJ SOLN
INTRAMUSCULAR | Status: DC | PRN
  Administered 2015-02-05: 50 ug via INTRAVENOUS

## 2015-02-05 MED ORDER — HEPARIN SODIUM (PORCINE) 1000 UNIT/ML IJ SOLN
INTRAMUSCULAR | Status: AC
  Filled 2015-02-05: qty 1

## 2015-02-05 MED ORDER — HEPARIN SODIUM (PORCINE) 5000 UNIT/ML IJ SOLN: 5000.0000 [IU] | Freq: Three times a day (TID) | INTRAMUSCULAR | Status: DC

## 2015-02-05 SURGICAL SUPPLY — 9 items
CATH INFINITI 5 FR JL3.5 (CATHETERS) ×3 IMPLANT
CATH INFINITI JR4 5F (CATHETERS) ×3 IMPLANT
DEVICE RAD COMP TR BAND LRG (VASCULAR PRODUCTS) ×3 IMPLANT
GLIDESHEATH SLEND A-KIT 6F 22G (SHEATH) ×3 IMPLANT
KIT HEART LEFT (KITS) ×3 IMPLANT
PACK CARDIAC CATHETERIZATION (CUSTOM PROCEDURE TRAY) ×3 IMPLANT
TRANSDUCER W/STOPCOCK (MISCELLANEOUS) ×3 IMPLANT
TUBING CIL FLEX 10 FLL-RA (TUBING) ×3 IMPLANT
WIRE SAFE-T 1.5MM-J .035X260CM (WIRE) ×3 IMPLANT

## 2015-02-05 NOTE — Discharge Summary (Signed)
Patient ID: DEAKIN LACEK MRN: [id number] DOB/AGE: April 13, 1940 75 y.o.  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 Jerene Dilling Course: Mr. Stann Mainland is 75 years of age and was referred by his primary cardiologist, Dr. Rozann Lesches. Catheterization was performed and demonstrated severe calcified coronary disease with moderately severe ostial left main. Other than a rather sudden onset episode of heart failure proximally 2 weeks ago, he has been relatively asymptomatic. All heart failure symptoms have resolved on medical therapy.  After identifying significant CAD, he was seen in consultation by cardiac surgery, Dr. Modesto Charon. Surgery is planned for 02/12/2015. The patient has requested to go home and be readmitted on the day of surgery. Both Dr. Roxan Hockey and I concur that it would be safe. Nitroglycerin is prescribed if recurrent symptoms of dyspnea or chest pain. He is cautioned to return if any cardiopulmonary complaints.   Discharge Exam: Blood pressure 141/77, pulse 74, temperature 98.9 F (37.2 C), temperature source Oral, resp. rate 16, height '5\' 8"'  (1.727 m), weight 177 lb 3.2 oz (80.377 kg), SpO2 96 %.    The radial cath site is unremarkable. Labs:   Lab Results  Component Value Date   WBC 7.6 02/05/2015   HGB 10.4* 02/05/2015   HCT 31.9* 02/05/2015   MCV 94.1 02/05/2015   PLT 216 02/05/2015    Recent Labs Lab 02/05/15 0631  NA 138  K 4.3  CL 103  CO2 27  BUN 14  CREATININE 1.07  CALCIUM 9.3  GLUCOSE 172*   No results found for: CKTOTAL, CKMB, CKMBINDEX, TROPONINI No  results found for: CHOL No results found for: HDL No results found for: LDLCALC No results found for: TRIG No results found for: CHOLHDL No results found for: LDLDIRECT    Radiology: No new data is available  EKG: Normal sinus rhythm with nonspecific ST-T wave abnormality.  FOLLOW UP PLANS AND APPOINTMENTS    Medication List    TAKE these medications        amLODipine 5 MG tablet  Commonly known as:  NORVASC  Take 5 mg by mouth daily.     aspirin 325 MG tablet  Take 1 tablet (325 mg total) by mouth daily.     Garlic 3536 MG Tabs  Take 1 tablet by mouth daily.     gemfibrozil 600 MG tablet  Commonly known as:  LOPID  Take 600 mg by mouth 2 (two) times daily before a meal.     glipiZIDE 5 MG 24 hr tablet  Commonly known as:  GLUCOTROL XL  Take 5 mg by mouth daily with breakfast.     Insulin Pen Needle 32G X 6 MM Misc  by Does not apply route.     lovastatin 20 MG tablet  Commonly known as:  MEVACOR  Take 20 mg by mouth every evening.     multivitamin tablet  Take 1 tablet by mouth daily.     pantoprazole 40 MG tablet  Commonly known as:  PROTONIX  Take 40 mg by mouth daily.     saw palmetto 500 MG capsule  Take 500 mg by mouth daily.     sitaGLIPtin-metformin 50-1000 MG tablet  Commonly known as:  JANUMET  Take 1 tablet by mouth 2 (two) times daily with a meal.     [place] Kit  by Does not apply route.         BRING ALL MEDICATIONS WITH YOU TO FOLLOW UP APPOINTMENTS  Time spent with patient to include physician time: 10 minutes Signed: Sinclair Grooms 02/05/2015, 5:28 PM

## 2015-02-05 NOTE — Progress Notes (Signed)
Utilization review completed.  

## 2015-02-05 NOTE — Discharge Instructions (Signed)
Call Dr. Jeannett SeniorStephen Sullivan's office on Tuesday 02/09/2015 if they have not called you . Phone [phone]

## 2015-02-05 NOTE — Consult Note (Signed)
Reason for Consult:Left main CAD Referring Physician: Dr. Mallie Mussel Smith/ Cardiology- Dr. Mikeal Hawthorne McDowell/ Primary- Dr. Rosita Fire  Patrick Sullivan is an 75 y.o. male.  HPI: Mr. Hallinan is a 75 yo man with a history of chronic atrial fibrillation, hypertension, hyperlipidemia and type II diabetes. He had no prior history of CAD. He was admitted to [facility] in December with a cc/o shortness of breath and orthopnea. He also fell face forward PTA. He is unsure if he lost consciousness (vague recollection). He does say that he was taking a new sleeping pill when it occurred. On admission his pro-BNP was 1646. Troponins ranged from 0.11 to 0.14. A CT angiogram showed no PE, but coronary calcifications were noted. An echocardiogram showed an EF of 50-55%. There was some calcification of the AV leaflets but no AS by report. He had mild MR and TR.  He was first diagnosed with atrial fib in May. He refused anticoagulation.  He saw Dr. Domenic Polite as an outpatient and catheterization was recommended.   He underwent cardiac cath today which revealed a 70% left main stenosis and diffuse 3 vessel CAD.   He denies any CP, pressure or tightness to me.  Past Medical History  Diagnosis Date  . Mixed hyperlipidemia   . Essential hypertension   . Type 2 diabetes mellitus ([place])   . Atrial fibrillation [facility])     Past Surgical History  Procedure Laterality Date  . Cholecystectomy  08/04/2014  . Cardiac catheterization N/A 02/05/2015    Procedure: Left Heart Cath and Coronary Angiography;  Surgeon: Belva Crome, MD;  Location: [facility];  Service: Cardiovascular;  Laterality: N/A;    Family History  Problem Relation Age of Onset  . Heart attack Father   . Bone cancer Son     Social History:  reports that he quit smoking about 30 years ago. His smoking use included Cigarettes. He does not have any smokeless tobacco history on file. He reports that he does not drink alcohol or use illicit  drugs.  Allergies: No Known Allergies  Medications:  Prior to Admission:  Prescriptions prior to admission  Medication Sig Dispense Refill Last Dose  . amLODipine (NORVASC) 5 MG tablet Take 5 mg by mouth daily.   02/04/2015  . aspirin 325 MG tablet Take 1 tablet (325 mg total) by mouth daily. 30 tablet 3 02/04/2015  . Blood Glucose Monitoring Suppl ([place]) KIT by Does not apply route.     . Garlic 3875 MG TABS Take 1 tablet by mouth daily.   02/04/2015  . gemfibrozil (LOPID) 600 MG tablet Take 600 mg by mouth 2 (two) times daily before a meal.   02/04/2015  . glipiZIDE (GLUCOTROL XL) 5 MG 24 hr tablet Take 5 mg by mouth daily with breakfast.   02/04/2015  . Insulin Pen Needle 32G X 6 MM MISC by Does not apply route.     . lovastatin (MEVACOR) 20 MG tablet Take 20 mg by mouth every evening.    02/04/2015  . Multiple Vitamin (MULTIVITAMIN) tablet Take 1 tablet by mouth daily.    02/04/2015  . pantoprazole (PROTONIX) 40 MG tablet Take 40 mg by mouth daily.   02/04/2015  . saw palmetto 500 MG capsule Take 500 mg by mouth daily.    02/04/2015  . sitaGLIPtin-metformin (JANUMET) 50-1000 MG per tablet Take 1 tablet by mouth 2 (two) times daily with a meal.   02/03/2015    Results for orders placed or  performed during the hospital encounter of 02/05/15 (from the past 48 hour(s))  Glucose, capillary     Status: Abnormal   Collection Time: 02/05/15  6:05 AM  Result Value Ref Range   Glucose-Capillary 170 (H) 65 - 99 mg/dL  Basic metabolic panel     Status: Abnormal   Collection Time: 02/05/15  6:31 AM  Result Value Ref Range   Sodium 138 135 - 145 mmol/L   Potassium 4.3 3.5 - 5.1 mmol/L   Chloride 103 101 - 111 mmol/L   CO2 27 22 - 32 mmol/L   Glucose, Bld 172 (H) 65 - 99 mg/dL   BUN 14 6 - 20 mg/dL   Creatinine, Ser 1.07 0.61 - 1.24 mg/dL   Calcium 9.3 8.9 - 10.3 mg/dL   GFR calc non Af Amer >60 >60 mL/min   GFR calc Af Amer >60 >60 mL/min    Comment: (NOTE) The eGFR has been calculated  using the CKD EPI equation. This calculation has not been validated in all clinical situations. eGFR's persistently <60 mL/min signify possible Chronic Kidney Disease.    Anion gap 8 5 - 15  CBC     Status: Abnormal   Collection Time: 02/05/15  6:31 AM  Result Value Ref Range   WBC 7.6 4.0 - 10.5 K/uL   RBC 3.39 (L) 4.22 - 5.81 MIL/uL   Hemoglobin 10.4 (L) 13.0 - 17.0 g/dL   HCT 31.9 (L) 39.0 - 52.0 %   MCV 94.1 78.0 - 100.0 fL   MCH 30.7 26.0 - 34.0 pg   MCHC 32.6 30.0 - 36.0 g/dL   RDW 13.4 11.5 - 15.5 %   Platelets 216 150 - 400 K/uL  Protime-INR     Status: Abnormal   Collection Time: 02/05/15  6:31 AM  Result Value Ref Range   Prothrombin Time 16.2 (H) 11.6 - 15.2 seconds   INR 1.29 0.00 - 1.49  Glucose, capillary     Status: Abnormal   Collection Time: 02/05/15  8:29 AM  Result Value Ref Range   Glucose-Capillary 168 (H) 65 - 99 mg/dL  Glucose, capillary     Status: Abnormal   Collection Time: 02/05/15 12:40 PM  Result Value Ref Range   Glucose-Capillary 237 (H) 65 - 99 mg/dL   Comment 1 Notify RN     No results found.  Review of Systems  Constitutional: Negative for fever, chills and malaise/fatigue.  Respiratory: Positive for shortness of breath. Negative for wheezing.   Cardiovascular: Positive for orthopnea. Negative for chest pain and leg swelling.  Musculoskeletal: Positive for back pain and joint pain.  Neurological: Positive for loss of consciousness (probable isolated syncopal episode). Negative for speech change, focal weakness and seizures.  All other systems reviewed and are negative.  Blood pressure 141/77, pulse 74, temperature 98.9 F (37.2 C), temperature source Oral, resp. rate 16, height '5\' 8"'  (1.727 m), weight 177 lb 3.2 oz (80.377 kg), SpO2 96 %. Physical Exam  Vitals reviewed. Constitutional: He is oriented to person, place, and time. He appears well-developed and well-nourished. No distress.  HENT:  Head: Normocephalic and atraumatic.   Mouth/Throat: No oropharyngeal exudate.  Eyes: Conjunctivae and EOM are normal. No scleral icterus.  Neck: Neck supple. No thyromegaly present.  No carotid bruits  Cardiovascular: Intact distal pulses.  Exam reveals no gallop.   No murmur heard. Irregularly irregular with variable split S2  Respiratory: Effort normal and breath sounds normal. He has no wheezes. He has no rales.  GI: Soft. There is no tenderness.  Musculoskeletal: He exhibits no edema.  Lymphadenopathy:    He has no cervical adenopathy.  Neurological: He is alert and oriented to person, place, and time. No cranial nerve deficit. He exhibits normal muscle tone.  Motor 5/5 bilaterally  Skin: Skin is warm and dry.  Psychiatric: He has a normal mood and affect.   CARDIAC CATHETERIZATION 1. Ost LM to LM lesion, 70% stenosed. 2. LM lesion, 50% stenosed. 3. Prox Cx to Dist Cx lesion, 65% stenosed. 4. Dist Cx lesion, 99% stenosed. 5. 3rd Mrg lesion, 95% stenosed. 6. Prox LAD to Dist LAD lesion, 60% stenosed. 7. Ost Cx lesion, 75% stenosed. 8. Prox RCA to Dist RCA lesion, 30% stenosed. 9. Dist RCA lesion, 65% stenosed.   Diffuse, calcified, severe three-vessel coronary disease including left main.  Reduced left ventricular systolic function with EF 35-40%  Recommendations:  Evaluation by TCTS to consider coronary bypass grafting, LA appendage clip, and possible MAZE.  Given clinical status , the patient will be admitted to the hospital for iv heparin and Evaluation by surgery.  I personally reviewed the catheterization images and concur with the findings as noted above.  Assessment/Plan: 75 yo man with left main and 3 vessel CAD. He was admitted with heart failure in December but other than that has had minimal to no symptoms(poor historian and may be minimizing). CABG is indicated for survival benefit and relief of symptoms.   He also has chronic atrial fibrillation. He has refused anticoagulation. A maze  procedure would be appropriate, if he would agree to at least short term anticoagulation. I would insist on anticoagulation for at least 6 weeks after a maze and preferably a longer duration. Clipping of the left atrial appendage would be beneficial regardless.  I have discussed with the general nature of the procedure, the need for general anesthesia, and the incisions to be used with Mr Leifheit and his granddaughter. We discussed the expected hospital stay, overall recovery and short and long term outcomes. I reviewed the indications, risks, benefits and alternatives. He understands the risks include, but are not limited to death, stroke, MI, DVT/PE, bleeding, possible need for transfusion, infections, cardiac arrhythmias, and other organ system dysfunction including respiratory, renal, or GI complications. He understands the maze would lengthen the surgery and increase risk of CPB-associated complications. There is ~80- 85% chance of success and 10- 15% chance of needing a pacemaker.  He wishes to consider his options before agreeing to surgery.  As it now stands the first available OR time is Friday 1/13  Melrose Nakayama 02/05/2015, 3:54 PM

## 2015-02-05 NOTE — Progress Notes (Signed)
ANTICOAGULATION CONSULT NOTE - Initial Consult  Pharmacy Consult for Heparin Indication: CAD, awaiting CABG consult  No Known Allergies  Patient Measurements: Height: 5\' 8"  (172.7 cm) Weight: 180 lb (81.647 kg) IBW/kg (Calculated) : 68.4 Heparin Dosing Weight: 81.6 kg  Vital Signs: Temp: 97.9 F (36.6 C) (01/06 0556) Temp Source: Oral (01/06 0556) BP: 171/57 mmHg (01/06 1135) Pulse Rate: 72 (01/06 1135)  Labs:  Recent Labs  02/05/15 0631  HGB 10.4*  HCT 31.9*  PLT 216  LABPROT 16.2*  INR 1.29  CREATININE 1.07    Estimated Creatinine Clearance: 57.7 mL/min (by C-G formula based on Cr of 1.07).   Medical History: Past Medical History  Diagnosis Date  . Mixed hyperlipidemia   . Essential hypertension   . Type 2 diabetes mellitus ([facility])   . Atrial fibrillation ([facility])     Medications:  Scheduled:  . amLODipine  5 mg Oral Daily    Assessment: 75 yo male s/p cath found with diffuse, calcified severe 3 V CAD, including left main.  Awaiting evaluation by TCTS for CABG.  Pharmacy asked to begin anticoagulation with IV heparin 8 hrs after sheath removed.  Sheath was removed at 0805 this AM.  No PTA anticoagulants noted (verified with patient), no hx bleeding to his knowledge.  Goal of Therapy:  Heparin level 0.3-0.7 units/ml Monitor platelets by anticoagulation protocol: Yes   Plan:  1. Start IV heparin at 4 pm at rate of 1150 units/hr, no bolus. 2. Check heparin level 6 hrs after gtt starts. 3. Daily heparin level and CBC. 4. F/u plans/timing of CABG.  Tad MooreJessica Derriona Branscom, Pharm D, BCPS  Clinical Pharmacist Pager [phone]  02/05/2015 1:07 PM

## 2015-02-05 NOTE — Progress Notes (Signed)
[id number] Went to see pt pre op. Pt stated had not seen surgeon yet and was not sure if he was going to have surgery or go home and come back. Left pt OHS booklet, care guide and wrote how to view pre op video in case pt is discharged and returns for surgery. Discussed with pt that walking is important after surgery. Also let pt know he will need someone with him 24/7 after surgery which he stated can be worked out. Luetta Nuttingharlene Megha Agnes RN BSN 02/05/2015 3:12 PM

## 2015-02-08 ENCOUNTER — Other Ambulatory Visit: Payer: Self-pay | Admitting: *Deleted

## 2015-02-08 ENCOUNTER — Encounter (HOSPITAL_COMMUNITY): Payer: Medicare HMO

## 2015-02-08 DIAGNOSIS — I2510 Atherosclerotic heart disease of native coronary artery without angina pectoris: Secondary | ICD-10-CM

## 2015-02-09 ENCOUNTER — Other Ambulatory Visit (HOSPITAL_COMMUNITY): Payer: Medicare HMO

## 2015-02-09 ENCOUNTER — Telehealth: Payer: Self-pay | Admitting: *Deleted

## 2015-02-09 ENCOUNTER — Telehealth: Payer: Self-pay | Admitting: Interventional Cardiology

## 2015-02-09 NOTE — Telephone Encounter (Signed)
Spoke with patient and he said that he wants to think about this a little longer and would contact us back with his decision to have procedure. Patient informed of the importance to help with his heart condition and without it, he is at a higher risk of having a cardiac event. Patient verbalized understanding and said that he would call us back if he decides to have the surgery.

## 2015-02-09 NOTE — Telephone Encounter (Signed)
-----   Message from Jonelle SidleSamuel G McDowell, MD sent at 02/09/2015 10:23 AM EST ----- Regarding: FW: CABG / Left MAin Isabelle CourseLydia, it may also be worth giving this patient a call so that he hears from our office as well. Important that he does continue with follow-up per TCTS with plan for CABG in light of his cardiac catheterization results.  ----- Message -----    From: Loreli SlotSteven C Hendrickson, MD    Sent: 02/09/2015   9:21 AM      To: Lyn RecordsHenry W Smith, MD, Jonelle SidleSamuel G McDowell, MD Subject: RE: CABG / Left MAin                           Hank and Sam  He called our office and cancelled all his preop studies and his surgery. We will try to get him back on board  Brett CanalesSteve ----- Message -----    From: Lyn RecordsHenry W Smith, MD    Sent: 02/09/2015   8:44 AM      To: Loreli SlotSteven C Hendrickson, MD, # Subject: CABG / Left MAin                               Hello Brett CanalesSteve and Sam,  This is just to notify you that Mr. Junita PushRodgers has avoided contact. I'm concerned that he may not show up for the planned CABG. Please let me know if you have from him.  Hank

## 2015-02-09 NOTE — Telephone Encounter (Signed)
Unable to contact the patient at home. Calling for the purpose to encourage him towards coronary bypass grafting. Unable to leave a message.

## 2015-02-10 ENCOUNTER — Encounter (HOSPITAL_COMMUNITY): Payer: Medicare HMO

## 2015-02-10 ENCOUNTER — Ambulatory Visit (HOSPITAL_COMMUNITY): Payer: Medicare HMO

## 2015-02-10 ENCOUNTER — Other Ambulatory Visit (HOSPITAL_COMMUNITY): Payer: Medicare HMO

## 2015-02-11 ENCOUNTER — Telehealth: Payer: Self-pay | Admitting: Cardiology

## 2015-02-11 ENCOUNTER — Ambulatory Visit (INDEPENDENT_AMBULATORY_CARE_PROVIDER_SITE_OTHER): Payer: Medicare HMO | Admitting: Cardiology

## 2015-02-11 ENCOUNTER — Encounter: Payer: Self-pay | Admitting: Cardiology

## 2015-02-11 VITALS — BP 181/79 | HR 67 | Ht 68.0 in | Wt 178.4 lb

## 2015-02-11 DIAGNOSIS — I25119 Atherosclerotic heart disease of native coronary artery with unspecified angina pectoris: Secondary | ICD-10-CM

## 2015-02-11 DIAGNOSIS — I10 Essential (primary) hypertension: Secondary | ICD-10-CM

## 2015-02-11 DIAGNOSIS — I255 Ischemic cardiomyopathy: Secondary | ICD-10-CM

## 2015-02-11 MED ORDER — AMLODIPINE BESYLATE 5 MG PO TABS: 7.5000 mg | ORAL_TABLET | Freq: Every day | ORAL | Status: DC

## 2015-02-11 NOTE — Patient Instructions (Signed)
Your physician has recommended you make the following change in your medication:  Increase amlodipine to 7.5 mg daily. Please take 1&1/2 of your 5 mg tablets daily. Continue all other medications the same. Please stay in contact with Dr. Sunday CornHendrickson's office regarding your surgery. Your physician recommends that you schedule a follow-up appointment in: 2 weeks.

## 2015-02-11 NOTE — Telephone Encounter (Signed)
Mr. Patrick Sullivan called requesting an appointment with Dr. Diona BrownerMcDowell. He is very concerned about having the CABG procedure. Please call granddaughter at [phone] ext # [phone]. Explained to her that we will have to have Mr. Patrick Sullivan sign a DPR.

## 2015-02-11 NOTE — Progress Notes (Signed)
Cardiology Office Note  Date: 02/11/2015   ID: Patrick Sullivan, DOB 25-Jun-1940, MRN [id number]  PCP: Glenda Chroman., MD  Primary Cardiologist: Rozann Lesches, MD   Chief Complaint  Patient presents with  . Coronary Artery Disease  . Cardiomyopathy    History of Present Illness: Patrick Sullivan is a 75 y.o. male that I recently saw on January 4 in follow-up of a hospital stay at [facility] - see prior note for details. 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 He was seen by TCTS to discuss surgical revascularization, and was scheduled to present for CABG with Dr. Roxan Hockey on January 13 (he wanted to go home first). I reviewed the subsequent chart notes regarding difficulty communicating with the patient to firm up plans for proceeding with surgery. Our office contacted the patient has well, and he expressed significant concerns about proceeding with surgery. I had him added on to the clinic today to discuss these issues and come up with a final plan.  He presents with 2 family members. We discussed the results of his cardiac catheterization in detail. We went over rationale for surgical revascularization, particularly in light of left main and multivessel disease in the setting of type 2 diabetes mellitus, and also associated with left ventricular dysfunction. I agree that CABG is indicated, and I recommended to Patrick Sullivan that he proceed to surgery as soon as possible.  He explains that he is simply "just not ready yet." He states that he wants to take care of some matters such as assigning future ownership of his house and also obtaining a living will, both reasonable things to take care of. Family members are already prepared to help him get this done soon. He also had some concerns about going to [facility] for preoperative testing, however it sounds like most of this could  be taken care of around the day of surgery. Also worried about being a burden to his family in terms of postoperative care.  After detailed discussion today which involved all of his family members present, his decision is to hold off on scheduling CABG for at least a few weeks. We reviewed his medications and I have recommended increasing Norvasc to 7.5 mg daily. I explained that we would also communicate with Dr. Leonarda Salon office to update them on the current situation and keep them involved in active contact to arrange the next most appropriate surgical date.   Past Medical History  Diagnosis Date  . Mixed hyperlipidemia   . Essential hypertension   . Type 2 diabetes mellitus ([place])   . Atrial fibrillation ([place])   . CAD (coronary artery disease)     Multivessel/left main disease  . Ischemic cardiomyopathy     LVEF 35-40%    Past Surgical History  Procedure Laterality Date  . Cholecystectomy  08/04/2014  . Cardiac catheterization N/A 02/05/2015    Procedure: Left Heart Cath and Coronary Angiography;  Surgeon: Belva Crome, MD;  Location: [facility];  Service: Cardiovascular;  Laterality: N/A;    Current Outpatient Prescriptions  Medication Sig Dispense Refill  . amLODipine (NORVASC) 5 MG tablet Take 1.5 tablets (7.5 mg total) by mouth daily. 45 tablet 2  . aspirin 325 MG tablet Take 1 tablet (325 mg total) by mouth daily. 30 tablet 3  . benazepril (LOTENSIN) 40 MG tablet Take 1 tablet by mouth daily.    . Blood  Glucose Monitoring Suppl ([place]) KIT by Does not apply route.    . diclofenac (VOLTAREN) 75 MG EC tablet Take 75 mg by mouth daily.    . Garlic 5852 MG TABS Take 1 tablet by mouth daily.    Marland Kitchen gemfibrozil (LOPID) 600 MG tablet Take 600 mg by mouth 2 (two) times daily before a meal.    . glipiZIDE (GLUCOTROL XL) 5 MG 24 hr tablet Take 5 mg by mouth daily with breakfast.    . Insulin Pen Needle 32G X 6 MM MISC by Does not apply route.    . lovastatin  (MEVACOR) 20 MG tablet Take 20 mg by mouth every evening.     . Multiple Vitamin (MULTIVITAMIN) tablet Take 1 tablet by mouth daily.     . nitroGLYCERIN (NITROSTAT) 0.4 MG SL tablet Place 1 tablet (0.4 mg total) under the tongue every 5 (five) minutes as needed for chest pain. 30 tablet 12  . saw palmetto 500 MG capsule Take 500 mg by mouth daily.     . sitaGLIPtin-metformin (JANUMET) 50-1000 MG per tablet Take 1 tablet by mouth 2 (two) times daily with a meal.     No current facility-administered medications for this visit.   Allergies:  Review of patient's allergies indicates no known allergies.   Social History: The patient  reports that he quit smoking about 30 years ago. His smoking use included Cigarettes. He does not have any smokeless tobacco history on file. He reports that he does not drink alcohol or use illicit drugs.   ROS:  Please see the history of present illness. Otherwise, complete review of systems is positive for NYHA class 2-3 dyspnea pending on level of activity.  All other systems are reviewed and negative.   Physical Exam: VS:  BP 181/79 mmHg  Pulse 67  Ht _0  (1.727 m)  Wt 178 lb 6.4 oz (80.922 kg)  BMI 27.13 kg/m2  SpO2 100%, BMI Body mass index is 27.13 kg/(m^2).  Wt Readings from Last 3 Encounters:  02/11/15 178 lb 6.4 oz (80.922 kg)  02/05/15 177 lb 3.2 oz (80.377 kg)  02/03/15 183 lb (83.008 kg)    General: Patient appears comfortable at rest. HEENT: Conjunctiva and lids normal, oropharynx clear with moist mucosa. Neck: Supple, no elevated JVP or carotid bruits, no thyromegaly. Lungs: Clear to auscultation, nonlabored breathing at rest. Cardiac: Irregular, no S3, 2/6 systolic murmur, no pericardial rub. Abdomen: Soft, nontender, bowel sounds present, no guarding or rebound. Extremities: No pitting edema, distal pulses 2+.  ECG: Tracing from 01/28/2015 showed rate-controlled atrial fibrillation at 76 bpm with nonspecific ST-T changes.  Recent  Labwork: 02/05/2015: BUN 14; Creatinine, Ser 1.07; Hemoglobin 10.4*; Platelets 216; Potassium 4.3; Sodium 138   Other Studies Reviewed Today:  Cardiac catheterization 02/05/2015: 1. Ost LM to LM lesion, 70% stenosed. 2. LM lesion, 50% stenosed. 3. Prox Cx to Dist Cx lesion, 65% stenosed. 4. Dist Cx lesion, 99% stenosed. 5. 3rd Mrg lesion, 95% stenosed. 6. Prox LAD to Dist LAD lesion, 60% stenosed. 7. Ost Cx lesion, 75% stenosed. 8. Prox RCA to Dist RCA lesion, 30% stenosed. 9. Dist RCA lesion, 65% stenosed.   Diffuse, calcified, severe three-vessel coronary disease including left main.  Reduced left ventricular systolic function with EF 35-40%  Assessment and Plan:  1. Multivessel/left main CAD with associated cardiomyopathy status post recent cardiac catheterization detailed above. Extensive discussion had with patient and family members today including review of his recent testing and recommendations by  Dr. Roxan Hockey. I agree that CABG is indicated and that the Patrick Sullivan should proceed as soon as possible. He has however decided to defer pursuing surgery for at least the next few weeks despite this recommendation. I have asked him to increase Norvasc to 7.5 mg daily, otherwise continue current cardiac regimen (blood pressure control). I have explained that if he has any sudden deterioration in clinical status he should present urgently for evaluation in the ER. He voiced understanding and appreciation of our discussion today and indicates that he is comfortable with his current decision. We will be forwarding this information to Dr. Leonarda Salon office to keep them involved and in active contact with the patient to help schedule the next surgical date.  2. Cardiomyopathy, likely ischemic based on CAD substrate. Hopefully with revascularization he will show improvement in LVEF. Currently not on beta blocker with low resting heart rate.  3. Essential hypertension, Norvasc being  increased. He has a blood pressure cuff at home and will check measurements as before.  Current medicines were reviewed with the patient today.  Disposition: FU with me in 2 weeks.   Signed, Satira Sark, MD, [facility] 02/11/2015 1:44 PM    [facility] at [place], [place], [place] [address] Phone: [phone]; Fax: [phone]

## 2015-02-11 NOTE — Telephone Encounter (Signed)
Per MD , patient is to be seen today with granddaughter present. Granddaughter advised to have patient here at office and ready to be seen at 12:45 pm today.

## 2015-02-12 ENCOUNTER — Encounter (HOSPITAL_COMMUNITY): Admission: RE | Payer: Self-pay | Source: Ambulatory Visit

## 2015-02-12 ENCOUNTER — Inpatient Hospital Stay (HOSPITAL_COMMUNITY)
Admission: RE | Admit: 2015-02-12 | Payer: Medicare HMO | Source: Ambulatory Visit | Admitting: Thoracic Surgery (Cardiothoracic Vascular Surgery)

## 2015-02-12 SURGERY — CORONARY ARTERY BYPASS GRAFTING (CABG)
Anesthesia: General | Site: Chest

## 2015-02-23 ENCOUNTER — Ambulatory Visit (INDEPENDENT_AMBULATORY_CARE_PROVIDER_SITE_OTHER): Payer: Medicare HMO | Admitting: Cardiology

## 2015-02-23 ENCOUNTER — Encounter: Payer: Self-pay | Admitting: Cardiology

## 2015-02-23 VITALS — BP 140/74 | HR 81 | Ht 68.0 in | Wt 184.0 lb

## 2015-02-23 DIAGNOSIS — I25119 Atherosclerotic heart disease of native coronary artery with unspecified angina pectoris: Secondary | ICD-10-CM | POA: Diagnosis not present

## 2015-02-23 DIAGNOSIS — I10 Essential (primary) hypertension: Secondary | ICD-10-CM | POA: Diagnosis not present

## 2015-02-23 DIAGNOSIS — I255 Ischemic cardiomyopathy: Secondary | ICD-10-CM

## 2015-02-23 NOTE — Progress Notes (Signed)
Cardiology Office Note  Date: 02/23/2015   ID: ROCKEY Sullivan, DOB [birth year], MRN [id number]  PCP: Patrick Chroman., MD  Primary Cardiologist: Patrick Lesches, MD   Chief Complaint  Patient presents with  . Coronary Artery Disease  . Cardiomyopathy    History of Present Illness: Patrick Sullivan is a 75 y.o. male seen recently in follow-up on January 12. Please see previous note for full discussion about patient's recent history. He is here today with his son for a follow-up visit. He tells me that he is still not prepared to pursue CABG and will defer even longer. He does not have a specific time frame in mind as best I can tell. His son states that the more people talk to him about having it done, the more upset he tends to get. He did get a living will prepared.  Fortunately, he does not report any recurring chest tightness or progressive shortness of breath. He states that he has been compliant with his medications which we reviewed below. His blood pressure is better controlled today after increase in Norvasc.  I have again recommended that he pursue CABG soon for revascularization of his multivessel/left main CAD.  Past Medical History  Diagnosis Date  . Mixed hyperlipidemia   . Essential hypertension   . Type 2 diabetes mellitus ([place])   . Atrial fibrillation ([place])   . CAD (coronary artery disease)     Multivessel/left main disease  . Ischemic cardiomyopathy     LVEF 35-40%    Current Outpatient Prescriptions  Medication Sig Dispense Refill  . amLODipine (NORVASC) 5 MG tablet Take 1.5 tablets (7.5 mg total) by mouth daily. 45 tablet 2  . aspirin 325 MG tablet Take 1 tablet (325 mg total) by mouth daily. 30 tablet 3  . benazepril (LOTENSIN) 40 MG tablet Take 1 tablet by mouth daily.    . Blood Glucose Monitoring Suppl ([place]) KIT by Does not apply route.    . diclofenac (VOLTAREN) 75 MG EC tablet Take 75 mg by mouth daily.    . Garlic 8592 MG TABS  Take 1 tablet by mouth daily.    Marland Kitchen gemfibrozil (LOPID) 600 MG tablet Take 600 mg by mouth 2 (two) times daily before a meal.    . glipiZIDE (GLUCOTROL XL) 5 MG 24 hr tablet Take 5 mg by mouth daily with breakfast.    . Insulin Pen Needle 32G X 6 MM MISC by Does not apply route.    . lovastatin (MEVACOR) 20 MG tablet Take 20 mg by mouth every evening.     . Multiple Vitamin (MULTIVITAMIN) tablet Take 1 tablet by mouth daily.     . nitroGLYCERIN (NITROSTAT) 0.4 MG SL tablet Place 1 tablet (0.4 mg total) under the tongue every 5 (five) minutes as needed for chest pain. 30 tablet 12  . saw palmetto 500 MG capsule Take 500 mg by mouth daily.     . sitaGLIPtin-metformin (JANUMET) 50-1000 MG per tablet Take 1 tablet by mouth 2 (two) times daily with a meal.     No current facility-administered medications for this visit.   Allergies:  Review of patient's allergies indicates no known allergies.   Social History: The patient  reports that he quit smoking about 30 years ago. His smoking use included Cigarettes. He does not have any smokeless tobacco history on file. He reports that he does not drink alcohol or use illicit drugs.   ROS:  Please see the history  of present illness. Otherwise, complete review of systems is positive for indigestion and gas intermittently.  All other systems are reviewed and negative.   Physical Exam: VS:  BP 140/74 mmHg  Pulse 81  Ht '5\' 8"'  (1.727 m)  Wt 184 lb (83.462 kg)  BMI 27.98 kg/m2  SpO2 99%, BMI Body mass index is 27.98 kg/(m^2).  Wt Readings from Last 3 Encounters:  02/23/15 184 lb (83.462 kg)  02/11/15 178 lb 6.4 oz (80.922 kg)  02/05/15 177 lb 3.2 oz (80.377 kg)    General: Patient appears comfortable at rest. HEENT: Conjunctiva and lids normal, oropharynx clear with moist mucosa. Neck: Supple, no elevated JVP or carotid bruits, no thyromegaly. Lungs: Clear to auscultation, nonlabored breathing at rest. Cardiac: Irregular, no S3, 2/6 systolic murmur,  no pericardial rub. Abdomen: Soft, nontender, bowel sounds present, no guarding or rebound. Extremities: No pitting edema, distal pulses 2+.  ECG: Tracing from 01/28/2015 showed rate-controlled atrial fibrillation at 76 bpm with nonspecific ST-T changes.  Recent Labwork: 02/05/2015: BUN 14; Creatinine, Ser 1.07; Hemoglobin 10.4*; Platelets 216; Potassium 4.3; Sodium 138   Other Studies Reviewed Today:  Cardiac catheterization 02/05/2015: 1. Ost LM to LM lesion, 70% stenosed. 2. LM lesion, 50% stenosed. 3. Prox Cx to Dist Cx lesion, 65% stenosed. 4. Dist Cx lesion, 99% stenosed. 5. 3rd Mrg lesion, 95% stenosed. 6. Prox LAD to Dist LAD lesion, 60% stenosed. 7. Ost Cx lesion, 75% stenosed. 8. Prox RCA to Dist RCA lesion, 30% stenosed. 9. Dist RCA lesion, 65% stenosed.   Diffuse, calcified, severe three-vessel coronary disease including left main.  Reduced left ventricular systolic function with EF 35-40%  Assessment and Plan:  1. Multivessel/left main CAD with associated cardiomyopathy as previously outlined. Mr. Patrick Sullivan has decided to defer plans for CABG and is not prepared to proceed at this time. I have discussed this decision extensively with him, and have cautioned him against deferring things too long, and have recommended to proceed with CABG as soon as possible. Fortunately, he does not report any accelerating symptoms now. We will continue medical therapy, he has requested a follow-up visit in the next month. I have again asked him to seek urgent medical attention if he suddenly deteriorates in terms of symptoms.  2. Cardiomyopathy, most likely ischemic in the setting of multivessel disease. It is hoped that with revascularization is LVEF might improve.  3. Essential hypertension, blood pressure is better controlled today. Continue current regimen.  Current medicines were reviewed with the patient today.   Disposition: FU with me in 1 month.   Signed, Satira Sark,  MD, [facility] 02/23/2015 9:06 AM    [facility] at [facility] [address] [place], [place] [address] Phone: [phone]; Fax: [phone]

## 2015-02-23 NOTE — Patient Instructions (Signed)
Your physician recommends that you schedule a follow-up appointment in: 1 month in the [facility] office with Dr.McDowell   Your physician recommends that you continue on your current medications as directed. Please refer to the Current Medication list given to you today.   If you need a refill on your cardiac medications before your next appointment, please call your pharmacy.   Thank you for choosing [facility] !

## 2015-04-05 ENCOUNTER — Encounter: Payer: Self-pay | Admitting: Cardiology

## 2015-04-05 ENCOUNTER — Ambulatory Visit (INDEPENDENT_AMBULATORY_CARE_PROVIDER_SITE_OTHER): Payer: Medicare HMO | Admitting: Cardiology

## 2015-04-05 VITALS — BP 138/76 | HR 66 | Ht 68.0 in | Wt 191.0 lb

## 2015-04-05 DIAGNOSIS — I255 Ischemic cardiomyopathy: Secondary | ICD-10-CM | POA: Diagnosis not present

## 2015-04-05 DIAGNOSIS — I2510 Atherosclerotic heart disease of native coronary artery without angina pectoris: Secondary | ICD-10-CM | POA: Diagnosis not present

## 2015-04-05 DIAGNOSIS — I10 Essential (primary) hypertension: Secondary | ICD-10-CM

## 2015-04-05 NOTE — Progress Notes (Signed)
Cardiology Office Note  Date: 04/05/2015   ID: NASEER HEARN, DOB [birth year], MRN [id number]  PCP: Glenda Chroman., MD  Primary Cardiologist: Rozann Lesches, MD   Chief Complaint  Patient presents with  . Coronary Artery Disease  . Cardiomyopathy    History of Present Illness: RALIEGH SCOBIE is a 75 y.o. male last seen in January. He has deferred pursuing CABG for treatment for multivessel/left main CAD with cardiomyopathy despite recommendations otherwise. He comes in today for a follow-up visit, tells me that he feels well, no angina symptoms, NYHA class II dyspnea with typical activities. He is been doing some work in his yard, picking up sticks and tree limbs.  We discussed his cardiac situation again today. He remains adamant that he does not want to pursue CABG at this time. We have discussed his increased risk adverse cardiac events and death.  Cardiac medications were reviewed. He continues on aspirin, Norvasc, Lotensin, Mevacor, and nitroglycerin. He is not on beta blocker with relatively low heart rate and atrial fibrillation. He has also declined anticoagulation for stroke prophylaxis.  Past Medical History  Diagnosis Date  . Mixed hyperlipidemia   . Essential hypertension   . Type 2 diabetes mellitus ([place])   . Atrial fibrillation ([place])   . CAD (coronary artery disease)     Multivessel/left main disease  . Ischemic cardiomyopathy     LVEF 35-40%    Current Outpatient Prescriptions  Medication Sig Dispense Refill  . amLODipine (NORVASC) 5 MG tablet Take 1.5 tablets (7.5 mg total) by mouth daily. 45 tablet 2  . aspirin 325 MG tablet Take 1 tablet (325 mg total) by mouth daily. 30 tablet 3  . benazepril (LOTENSIN) 40 MG tablet Take 1 tablet by mouth daily.    . Blood Glucose Monitoring Suppl ([place]) KIT by Does not apply route.    . diclofenac (VOLTAREN) 75 MG EC tablet Take 75 mg by mouth daily.    . Garlic 0929 MG TABS Take 1 tablet by mouth  daily.    Marland Kitchen gemfibrozil (LOPID) 600 MG tablet Take 600 mg by mouth 2 (two) times daily before a meal.    . glipiZIDE (GLUCOTROL XL) 5 MG 24 hr tablet Take 5 mg by mouth daily with breakfast.    . Insulin Pen Needle 32G X 6 MM MISC by Does not apply route.    . lovastatin (MEVACOR) 20 MG tablet Take 20 mg by mouth every evening.     . Multiple Vitamin (MULTIVITAMIN) tablet Take 1 tablet by mouth daily.     . nitroGLYCERIN (NITROSTAT) 0.4 MG SL tablet Place 1 tablet (0.4 mg total) under the tongue every 5 (five) minutes as needed for chest pain. 30 tablet 12  . pantoprazole (PROTONIX) 40 MG tablet Take 1 tablet by mouth daily.    . saw palmetto 500 MG capsule Take 500 mg by mouth daily.     . sitaGLIPtin-metformin (JANUMET) 50-1000 MG per tablet Take 1 tablet by mouth 2 (two) times daily with a meal.     No current facility-administered medications for this visit.   Allergies:  Review of patient's allergies indicates no known allergies.   Social History: The patient  reports that he quit smoking about 30 years ago. His smoking use included Cigarettes. He does not have any smokeless tobacco history on file. He reports that he does not drink alcohol or use illicit drugs.   ROS:  Please see the history of present illness.  Otherwise, complete review of systems is positive for intermittent arthritic pains.  All other systems are reviewed and negative.   Physical Exam: VS:  BP 138/76 mmHg  Pulse 66  Ht '5\' 8"'  (1.727 m)  Wt 191 lb (86.637 kg)  BMI 29.05 kg/m2  SpO2 99%, BMI Body mass index is 29.05 kg/(m^2).  Wt Readings from Last 3 Encounters:  04/05/15 191 lb (86.637 kg)  02/23/15 184 lb (83.462 kg)  02/11/15 178 lb 6.4 oz (80.922 kg)    General: Patient appears comfortable at rest. HEENT: Conjunctiva and lids normal, oropharynx clear with moist mucosa. Neck: Supple, no elevated JVP or carotid bruits, no thyromegaly. Lungs: Clear to auscultation, nonlabored breathing at rest. Cardiac:  Irregular, no S3, 2/6 systolic murmur, no pericardial rub. Abdomen: Soft, nontender, bowel sounds present, no guarding or rebound. Extremities: No pitting edema, distal pulses 2+.  ECG: I personally reviewed the prior tracing from 01/28/2015 which showed atrial fibrillation with nonspecific ST changes.  Recent Labwork: 02/05/2015: BUN 14; Creatinine, Ser 1.07; Hemoglobin 10.4*; Platelets 216; Potassium 4.3; Sodium 138   Other Studies Reviewed Today:  Cardiac catheterization 02/05/2015: 1. Ost LM to LM lesion, 70% stenosed. 2. LM lesion, 50% stenosed. 3. Prox Cx to Dist Cx lesion, 65% stenosed. 4. Dist Cx lesion, 99% stenosed. 5. 3rd Mrg lesion, 95% stenosed. 6. Prox LAD to Dist LAD lesion, 60% stenosed. 7. Ost Cx lesion, 75% stenosed. 8. Prox RCA to Dist RCA lesion, 30% stenosed. 9. Dist RCA lesion, 65% stenosed.   Diffuse, calcified, severe three-vessel coronary disease including left main.  Reduced left ventricular systolic function with EF 35-40%  Assessment and Plan:  1. Multivessel/left main CAD with associated cardiomyopathy. CABG is been recommended for revascularization in addition to medical therapy, however he declines surgery at this point and remains adamant about this decision. We have discussed his increased risk of adverse cardiac events and death. Plan will be continue medical therapy unless he changes his mind or symptoms escalate.  2. Essential hypertension, no changes made to current regimen.  Current medicines were reviewed with the patient today.  Disposition: FU with me in 3 months.   Signed, Satira Sark, MD, [facility] 04/05/2015 2:52 PM    [facility] at [address], [place], [place] [address] Phone: [phone]; Fax: [phone]

## 2015-04-05 NOTE — Patient Instructions (Signed)
Your physician recommends that you schedule a follow-up appointment in: 3 months with Dr. McDowell  Your physician recommends that you continue on your current medications as directed. Please refer to the Current Medication list given to you today.  Thank you for choosing [facility]!!    

## 2015-06-03 ENCOUNTER — Other Ambulatory Visit: Payer: Self-pay | Admitting: Cardiology

## 2015-07-07 ENCOUNTER — Encounter: Payer: Self-pay | Admitting: Cardiology

## 2015-07-07 ENCOUNTER — Ambulatory Visit (INDEPENDENT_AMBULATORY_CARE_PROVIDER_SITE_OTHER): Payer: Medicare HMO | Admitting: Cardiology

## 2015-07-07 VITALS — BP 130/78 | HR 62 | Ht 68.0 in | Wt 183.0 lb

## 2015-07-07 DIAGNOSIS — I10 Essential (primary) hypertension: Secondary | ICD-10-CM

## 2015-07-07 DIAGNOSIS — I2510 Atherosclerotic heart disease of native coronary artery without angina pectoris: Secondary | ICD-10-CM | POA: Diagnosis not present

## 2015-07-07 DIAGNOSIS — I255 Ischemic cardiomyopathy: Secondary | ICD-10-CM

## 2015-07-07 NOTE — Patient Instructions (Signed)
Your physician recommends that you continue on your current medications as directed. Please refer to the Current Medication list given to you today. Your physician recommends that you schedule a follow-up appointment in: 4 month. You will receive a reminder letter in the mail in about 2 months reminding you to call and schedule your appointment. If you don't receive this letter, please contact our office.

## 2015-07-07 NOTE — Progress Notes (Signed)
Cardiology Office Note  Date: 07/07/2015   ID: Patrick Sullivan, DOB 1941-01-31, MRN [id number]  PCP: Glenda Chroman, MD  Primary Cardiologist: Rozann Lesches, MD   Chief Complaint  Patient presents with  . Coronary Artery Disease  . Cardiomyopathy    History of Present Illness: Patrick Sullivan is a 75 y.o. male last seen in March. He has deferred pursuing CABG for treatment for multivessel/left main CAD with cardiomyopathy despite recommendations otherwise. He presents today to discuss symptoms, continues to deny any problems with angina or need for nitroglycerin. He has been working out in his yard, some with the garden. Limited by arthritis symptoms in his legs and hands.  We went over his medications. Current cardiac regimen includes aspirin, Norvasc, Lotensin, Mevacor, and as stated nitroglycerin.  He tells me again today that he does not want to pursue heart surgery and remains comfortable with observation at this time.  Past Medical History  Diagnosis Date  . Mixed hyperlipidemia   . Essential hypertension   . Type 2 diabetes mellitus ([place])   . Atrial fibrillation ([place])   . CAD (coronary artery disease)     Multivessel/left main disease  . Ischemic cardiomyopathy     LVEF 35-40%    Current Outpatient Prescriptions  Medication Sig Dispense Refill  . amLODipine (NORVASC) 5 MG tablet TAKE ONE & ONE-HALF TABLETS BY MOUTH ONCE DAILY 45 tablet 6  . aspirin 325 MG tablet Take 1 tablet (325 mg total) by mouth daily. 30 tablet 3  . benazepril (LOTENSIN) 40 MG tablet Take 1 tablet by mouth daily.    . Blood Glucose Monitoring Suppl ([place]) KIT by Does not apply route.    . diclofenac (VOLTAREN) 75 MG EC tablet Take 75 mg by mouth daily.    . Garlic 2595 MG TABS Take 1 tablet by mouth daily.    Marland Kitchen gemfibrozil (LOPID) 600 MG tablet Take 600 mg by mouth 2 (two) times daily before a meal.    . glipiZIDE (GLUCOTROL XL) 5 MG 24 hr tablet Take 5 mg by mouth daily  with breakfast.    . Insulin Pen Needle 32G X 6 MM MISC by Does not apply route.    . lovastatin (MEVACOR) 20 MG tablet Take 20 mg by mouth every evening.     . Multiple Vitamin (MULTIVITAMIN) tablet Take 1 tablet by mouth daily.     . nitroGLYCERIN (NITROSTAT) 0.4 MG SL tablet Place 1 tablet (0.4 mg total) under the tongue every 5 (five) minutes as needed for chest pain. 30 tablet 12  . pantoprazole (PROTONIX) 40 MG tablet Take 1 tablet by mouth daily.    . saw palmetto 500 MG capsule Take 500 mg by mouth daily.     . sitaGLIPtin-metformin (JANUMET) 50-1000 MG per tablet Take 1 tablet by mouth 2 (two) times daily with a meal.     No current facility-administered medications for this visit.   Allergies:  Review of patient's allergies indicates no known allergies.   Social History: The patient  reports that he quit smoking about 30 years ago. His smoking use included Cigarettes. He has never used smokeless tobacco. He reports that he does not drink alcohol or use illicit drugs.   ROS:  Please see the history of present illness. Otherwise, complete review of systems is positive for arthritic pains.  All other systems are reviewed and negative.   Physical Exam: VS:  BP 130/78 mmHg  Pulse 62  Ht  _0  (1.727 m)  Wt 183 lb (83.008 kg)  BMI 27.83 kg/m2  SpO2 99%, BMI Body mass index is 27.83 kg/(m^2).  Wt Readings from Last 3 Encounters:  07/07/15 183 lb (83.008 kg)  04/05/15 191 lb (86.637 kg)  02/23/15 184 lb (83.462 kg)    General: Patient appears comfortable at rest. HEENT: Conjunctiva and lids normal, oropharynx clear with moist mucosa. Neck: Supple, no elevated JVP or carotid bruits, no thyromegaly. Lungs: Clear to auscultation, nonlabored breathing at rest. Cardiac: Irregular, no S3, 2/6 systolic murmur, no pericardial rub. Abdomen: Soft, nontender, bowel sounds present, no guarding or rebound. Extremities: No pitting edema, distal pulses 2+.  ECG: I personally reviewed the  prior tracing from 01/28/2015 which showed atrial fibrillation with nonspecific ST changes.  Recent Labwork: 02/05/2015: BUN 14; Creatinine, Ser 1.07; Hemoglobin 10.4*; Platelets 216; Potassium 4.3; Sodium 138   Other Studies Reviewed Today:  Cardiac catheterization 02/05/2015: 1. Ost LM to LM lesion, 70% stenosed. 2. LM lesion, 50% stenosed. 3. Prox Cx to Dist Cx lesion, 65% stenosed. 4. Dist Cx lesion, 99% stenosed. 5. 3rd Mrg lesion, 95% stenosed. 6. Prox LAD to Dist LAD lesion, 60% stenosed. 7. Ost Cx lesion, 75% stenosed. 8. Prox RCA to Dist RCA lesion, 30% stenosed. 9. Dist RCA lesion, 65% stenosed.   Diffuse, calcified, severe three-vessel coronary disease including left main.  Reduced left ventricular systolic function with EF 35-40%  Assessment and Plan:  1. Multivessel/left main CAD with associated cardiomyopathy. CABG is been recommended for revascularization in addition to medical therapy, however he continues to decline surgery. We have discussed his increased risk of adverse cardiac events and death. Any medical therapy and observation.  2. Essential hypertension, blood pressure control is reasonable today.  Current medicines were reviewed with the patient today.  Disposition: FU with me in 4 months.   Signed, Satira Sark, MD, [facility] 07/07/2015 10:50 AM    [facility] [place], [place], [place] [address] Phone: [phone]; Fax: [phone]

## 2015-11-09 NOTE — Progress Notes (Signed)
Cardiology Office Note  Date: 11/10/2015   ID: Patrick Sullivan, DOB 1940/08/26, MRN [id number]  PCP: Glenda Chroman, MD  Primary Cardiologist: Rozann Lesches, MD   Chief Complaint  Patient presents with  . Coronary Artery Disease    History of Present Illness: Patrick Sullivan is a 75 y.o. male last seen in June. He has deferred pursuing CABG for treatment for multivessel/left main CAD with cardiomyopathy despite recommendations otherwise. Fortunately, he reports no significant angina symptoms with his usual activities and states that he has been compliant with medications.  Current regimen includes aspirin, Norvasc, Mevacor, and as needed nitroglycerin. He follows blood sugar control with Dr. Woody Seller, reports recent improvement in hemoglobin A1c from 8.1% to 6.9%.  Past Medical History:  Diagnosis Date  . Atrial fibrillation ([place] Sullivan)   . CAD (coronary artery disease)    Multivessel/left main disease  . Essential hypertension   . Ischemic cardiomyopathy    LVEF 35-40%  . Mixed hyperlipidemia   . Type 2 diabetes mellitus (Patrick Sullivan)     Current Outpatient Prescriptions  Medication Sig Dispense Refill  . amLODipine (NORVASC) 5 MG tablet TAKE ONE & ONE-HALF TABLETS BY MOUTH ONCE DAILY 45 tablet 6  . aspirin 325 MG tablet Take 162 mg by mouth 2 (two) times daily.    . benazepril (LOTENSIN) 40 MG tablet Take 1 tablet by mouth daily.    . Blood Glucose Monitoring Suppl ([place]) KIT by Does not apply route.    . diclofenac (VOLTAREN) 75 MG EC tablet Take 75 mg by mouth daily.    . Garlic 4098 MG TABS Take 1 tablet by mouth daily.    Marland Kitchen gemfibrozil (LOPID) 600 MG tablet Take 600 mg by mouth 2 (two) times daily before a meal.    . glipiZIDE (GLUCOTROL XL) 5 MG 24 hr tablet Take 5 mg by mouth daily with breakfast.    . Insulin Pen Needle 32G X 6 MM MISC by Does not apply route.    . lovastatin (MEVACOR) 20 MG tablet Take 20 mg by mouth every evening.     . Multiple Vitamin  (MULTIVITAMIN) tablet Take 1 tablet by mouth daily.     . nitroGLYCERIN (NITROSTAT) 0.4 MG SL tablet Place 1 tablet (0.4 mg total) under the tongue every 5 (five) minutes as needed for chest pain. 30 tablet 12  . saw palmetto 500 MG capsule Take 500 mg by mouth daily.     . sitaGLIPtin-metformin (JANUMET) 50-1000 MG per tablet Take 1 tablet by mouth 2 (two) times daily with a meal.     No current facility-administered medications for this visit.    Allergies:  Review of patient's allergies indicates no known allergies.   Social History: The patient  reports that he quit smoking about 31 years ago. His smoking use included Cigarettes. He started smoking about 61 years ago. He has a 30.00 pack-year smoking history. He has never used smokeless tobacco. He reports that he does not drink alcohol or use drugs.   ROS:  Please see the history of present illness. Otherwise, complete review of systems is positive for none.  All other systems are reviewed and negative.   Physical Exam: VS:  BP (!) 144/73   Pulse 76   Ht '5\' 8"'  (1.727 m)   Wt 185 lb (83.9 kg)   BMI 28.13 kg/m , BMI Body mass index is 28.13 kg/m.  Wt Readings from Last 3 Encounters:  11/10/15 185 lb (83.9  kg)  07/07/15 183 lb (83 kg)  04/05/15 191 lb (86.6 kg)    General: Patient appears comfortable at rest. HEENT: Conjunctiva and lids normal, oropharynx clear with moist mucosa. Neck: Supple, no elevated JVP or carotid bruits, no thyromegaly. Lungs: Clear to auscultation, nonlabored breathing at rest. Cardiac: Irregular, no S3, 2/6 systolic murmur, no pericardial rub. Abdomen: Soft, nontender, bowel sounds present, no guarding or rebound. Extremities: No pitting edema, distal pulses 2+.  ECG: I personally reviewed the tracing from 01/28/2015 which showed atrial fibrillation with nonspecific ST changes.  Recent Labwork: 02/05/2015: BUN 14; Creatinine, Ser 1.07; Hemoglobin 10.4; Platelets 216; Potassium 4.3; Sodium 138   Other  Studies Reviewed Today:  Cardiac catheterization 02/05/2015: 1. Ost LM to LM lesion, 70% stenosed. 2. LM lesion, 50% stenosed. 3. Prox Cx to Dist Cx lesion, 65% stenosed. 4. Dist Cx lesion, 99% stenosed. 5. 3rd Mrg lesion, 95% stenosed. 6. Prox LAD to Dist LAD lesion, 60% stenosed. 7. Ost Cx lesion, 75% stenosed. 8. Prox RCA to Dist RCA lesion, 30% stenosed. 9. Dist RCA lesion, 65% stenosed.   Diffuse, calcified, severe three-vessel coronary disease including left main.  Reduced left ventricular systolic function with EF 35-40%  Assessment and Plan:  1. Symptomatically stable multivessel/left main CAD with associated cardiomyopathy. Patient has declined CABG which has been recommended for revascularization. We will continue with observation on current regimen, we have discussed warning signs and he tells me that he will let me know if the situation changes.  2. Essential hypertension, continue Norvasc.  3. Hyperlipidemia, on Mevacor.  Current medicines were reviewed with the patient today.  Disposition: Follow-up with me in 4 months.  Signed, Satira Sark, MD, [facility] 11/10/2015 11:30 AM    [place], [place], [place] [address] Phone: [phone]; Fax: [phone]

## 2015-11-10 ENCOUNTER — Encounter: Payer: Self-pay | Admitting: Cardiology

## 2015-11-10 ENCOUNTER — Ambulatory Visit (INDEPENDENT_AMBULATORY_CARE_PROVIDER_SITE_OTHER): Payer: Medicare HMO | Admitting: Cardiology

## 2015-11-10 VITALS — BP 144/73 | HR 76 | Ht 68.0 in | Wt 185.0 lb

## 2015-11-10 DIAGNOSIS — I2510 Atherosclerotic heart disease of native coronary artery without angina pectoris: Secondary | ICD-10-CM | POA: Diagnosis not present

## 2015-11-10 DIAGNOSIS — I10 Essential (primary) hypertension: Secondary | ICD-10-CM | POA: Diagnosis not present

## 2015-11-10 NOTE — Patient Instructions (Signed)

## 2016-02-23 ENCOUNTER — Encounter: Payer: Self-pay | Admitting: Cardiology

## 2016-02-23 ENCOUNTER — Ambulatory Visit (INDEPENDENT_AMBULATORY_CARE_PROVIDER_SITE_OTHER): Payer: Medicare HMO | Admitting: Cardiology

## 2016-02-23 VITALS — BP 148/80 | HR 95 | Ht 69.0 in | Wt 188.0 lb

## 2016-02-23 DIAGNOSIS — I481 Persistent atrial fibrillation: Secondary | ICD-10-CM

## 2016-02-23 DIAGNOSIS — Z01810 Encounter for preprocedural cardiovascular examination: Secondary | ICD-10-CM

## 2016-02-23 DIAGNOSIS — E782 Mixed hyperlipidemia: Secondary | ICD-10-CM | POA: Diagnosis not present

## 2016-02-23 DIAGNOSIS — I25119 Atherosclerotic heart disease of native coronary artery with unspecified angina pectoris: Secondary | ICD-10-CM

## 2016-02-23 DIAGNOSIS — I4819 Other persistent atrial fibrillation: Secondary | ICD-10-CM

## 2016-02-23 NOTE — Progress Notes (Signed)
Cardiology Office Note  Date: 02/23/2016   ID: Patrick Sullivan, DOB [birth year], MRN [id number]  PCP: Glenda Chroman, MD  Primary Cardiologist: Rozann Lesches, MD   Chief Complaint  Patient presents with  . Preoperative assessment    History of Present Illness: Patrick Sullivan is a 76 y.o. male referred to the office for preoperative assessment. He was last seen in October 2017. He is being considered for right carpal tunnel surgery with Dr. French Ana of [place]. I am not certain about the type of planned anesthesia, whether general or local.  As noted previously he has a history of left main/multivessel CAD documented at cardiac catheterization back in January 2017. At that time CABG was recommended for revascularization, however the patient declined surgery and has maintained this position since then despite my recommendations otherwise. LVEF was 35-40% at that point as well. He has been followed on medical therapy (not on beta blocker due to low heart rate). Fortunately, he has been relatively stable without accelerating angina symptoms and has not had an acute cardiac event in the last year.  He also has persistent atrial fibrillation, has declined anticoagulation. I reviewed his ECG today which shows rate-controlled atrial fibrillation.  Today we discussed his cardiac history and the fact this places him at high risk for adverse cardiac events with surgery. This risk would obviously be higher with general anesthesia and a prolonged procedure, however if he were to undergo a local block the risk would be less.  Reviewed his medications which are outlined below. He reports compliance. He follows with Dr. Woody Seller for primary care.  Past Medical History:  Diagnosis Date  . Atrial fibrillation ([place])   . CAD (coronary artery disease)    Multivessel/left main disease  . Essential hypertension   . Ischemic cardiomyopathy    LVEF 35-40%  . Mixed hyperlipidemia   .  Type 2 diabetes mellitus ([place])     Past Surgical History:  Procedure Laterality Date  . CARDIAC CATHETERIZATION N/A 02/05/2015   Procedure: Left Heart Cath and Coronary Angiography;  Surgeon: Belva Crome, MD;  Location: [facility];  Service: Cardiovascular;  Laterality: N/A;  . CHOLECYSTECTOMY  08/04/2014    Current Outpatient Prescriptions  Medication Sig Dispense Refill  . amLODipine (NORVASC) 5 MG tablet TAKE ONE & ONE-HALF TABLETS BY MOUTH ONCE DAILY 45 tablet 6  . aspirin 325 MG tablet Take 162 mg by mouth 2 (two) times daily.    . benazepril (LOTENSIN) 40 MG tablet Take 1 tablet by mouth daily.    . Blood Glucose Monitoring Suppl ([place]) KIT by Does not apply route.    . diclofenac (VOLTAREN) 75 MG EC tablet Take 75 mg by mouth daily.    . Garlic 3235 MG TABS Take 1 tablet by mouth daily.    Marland Kitchen gemfibrozil (LOPID) 600 MG tablet Take 600 mg by mouth 2 (two) times daily before a meal.    . glipiZIDE (GLUCOTROL XL) 5 MG 24 hr tablet Take 5 mg by mouth daily with breakfast.    . Insulin Pen Needle 32G X 6 MM MISC by Does not apply route.    . lovastatin (MEVACOR) 20 MG tablet Take 20 mg by mouth every evening.     . metFORMIN (GLUCOPHAGE) 500 MG tablet Take 500 mg by mouth 2 (two) times daily with a meal.     . Multiple Vitamin (MULTIVITAMIN) tablet Take 1 tablet by mouth daily.     Marland Kitchen  nitroGLYCERIN (NITROSTAT) 0.4 MG SL tablet Place 1 tablet (0.4 mg total) under the tongue every 5 (five) minutes as needed for chest pain. 30 tablet 12  . saw palmetto 500 MG capsule Take 500 mg by mouth daily.     . sitaGLIPtin-metformin (JANUMET) 50-1000 MG per tablet Take 1 tablet by mouth 2 (two) times daily with a meal.     No current facility-administered medications for this visit.    Allergies:  Patient has no known allergies.   Social History: The patient  reports that he quit smoking about 31 years ago. His smoking use included Cigarettes. He started smoking about 61  years ago. He has a 30.00 pack-year smoking history. He has never used smokeless tobacco. He reports that he does not drink alcohol or use drugs.   ROS:  Please see the history of present illness. Otherwise, complete review of systems is positive for knee pain and right wrist pain.  All other systems are reviewed and negative.   Physical Exam: VS:  BP (!) 148/80   Pulse 95   Ht '5\' 9"'  (1.753 m)   Wt 188 lb (85.3 kg)   SpO2 97%   BMI 27.76 kg/m , BMI Body mass index is 27.76 kg/m.  Wt Readings from Last 3 Encounters:  02/23/16 188 lb (85.3 kg)  11/10/15 185 lb (83.9 kg)  07/07/15 183 lb (83 kg)    General: Patient appears comfortable at rest. HEENT: Conjunctiva and lids normal, oropharynx clear. Neck: Supple, no elevated JVP or carotid bruits, no thyromegaly. Lungs: Clear to auscultation, nonlabored breathing at rest. Cardiac: Irregularly irregular, no S3, 2/6 systolic systolic murmur, no pericardial rub. Abdomen: Soft, nontender, bowel sounds present. Extremities: No pitting edema, distal pulses 2+. Skin: Warm and dry. Musculoskeletal: No kyphosis. Neuropsychiatric: Alert and oriented x3, affect grossly appropriate.  ECG: I personally reviewed the tracing from 01/28/2015 which showed rate controlled atrial fibrillation with NSST changes.    Recent Labwork:  January 2017: Hgb 10.4, potassium 4.3, BUN 14  Other Studies Reviewed Today:  Cardiac catheterization 02/05/2015: 1. Ost LM to LM lesion, 70% stenosed. 2. LM lesion, 50% stenosed. 3. Prox Cx to Dist Cx lesion, 65% stenosed. 4. Dist Cx lesion, 99% stenosed. 5. 3rd Mrg lesion, 95% stenosed. 6. Prox LAD to Dist LAD lesion, 60% stenosed. 7. Ost Cx lesion, 75% stenosed. 8. Prox RCA to Dist RCA lesion, 30% stenosed. 9. Dist RCA lesion, 65% stenosed.    Diffuse, calcified, severe three-vessel coronary disease including left main.  Reduced left ventricular systolic function with EF 35-40%  Assessment and Plan:  1.  Preoperative evaluation in a 76 year old male with non-revascularized severe multivessel/left main disease for which CABG was recommended however he declined in January of last year. He has fortunately been relatively stable on medical therapy with no obvious acute cardiac events in the interim. His perioperative cardiac risk however still remains high even with an elective operation although certainly this risk would be higher with general anesthesia that if he had a local block. I let him know that I did not "clear" him for surgery based on this high risk and have already sent the information back to his surgeon. Were he to ever consider undergoing bypass surgery for revascularization and had an appropriate recovery with continued medical therapy, this would have a positive impact on future surgical risk.  2. Persistent/chronic atrial fibrillation, heart rate adequately controlled at baseline. He has declined anticoagulation as noted previously and remains at increased risk of stroke  with CHADSVASC score of 6.  3. Ischemic cardiomyopathy with LVEF 35-40%. Not on beta blocker with low resting heart rate. Not planning to pursue device therapies, particularly in light of his non-revascularized coronaries.  4. Hyperlipidemia, on statin therapy.  Current medicines were reviewed with the patient today.   Orders Placed This Encounter  Procedures  . EKG 12-Lead    Disposition: Follow-up in 4 months.  Signed, Satira Sark, MD, [facility] 02/23/2016 1:20 PM    [place] [address] [place], [place] [address] Phone: [phone]; Fax: [phone]

## 2016-02-23 NOTE — Patient Instructions (Signed)
Medication Instructions:  Your physician recommends that you continue on your current medications as directed. Please refer to the Current Medication list given to you today.  Labwork: none  Testing/Procedures: none  Follow-Up: Your physician wants you to follow-up in: 4 months.  You will receive a reminder letter in the mail two months in advance. If you don't receive a letter, please call our office to schedule the follow-up appointment.   Any Other Special Instructions Will Be Listed Below (If Applicable).     If you need a refill on your cardiac medications before your next appointment, please call your pharmacy.   

## 2016-03-22 ENCOUNTER — Telehealth: Payer: Self-pay | Admitting: Cardiology

## 2016-03-22 NOTE — Telephone Encounter (Signed)
Mr. Junita PushRodgers called stating that Dr. Madelon Lipsaffrey needs to know if patient has been cleared for  Surgery.[facility]affrey [facility]

## 2016-03-22 NOTE — Telephone Encounter (Signed)
Dr. Ival BibleMcDowell's last office note regarding surgical clearance sent to Dr. Madelon Lipsaffrey. Patient notified

## 2017-05-01 ENCOUNTER — Encounter: Payer: Self-pay | Admitting: Neurology

## 2017-05-01 ENCOUNTER — Ambulatory Visit: Payer: Medicare HMO | Admitting: Neurology

## 2017-05-01 ENCOUNTER — Encounter (INDEPENDENT_AMBULATORY_CARE_PROVIDER_SITE_OTHER): Payer: Self-pay

## 2017-05-01 VITALS — BP 162/75 | HR 65 | Ht 69.0 in | Wt 190.5 lb

## 2017-05-01 DIAGNOSIS — R202 Paresthesia of skin: Secondary | ICD-10-CM

## 2017-05-01 DIAGNOSIS — G5601 Carpal tunnel syndrome, right upper limb: Secondary | ICD-10-CM | POA: Insufficient documentation

## 2017-05-01 NOTE — Progress Notes (Signed)
PATIENT: Patrick Sullivan DOB: [birth year]  Chief Complaint  Patient presents with  . Carpal Tunnel    He is with his son, Patrick Sullivan, today. He is here to have his right hand carpal tunnel syndrome further evaluated.  He has numbness in his first three fingers and weakness in his hand.  He does not use a splint.  He has never had a NCV/EMG in the past.  . PCP    Patrick Chroman, MD     HISTORICAL  Patrick Sullivan is a 77 year old male, seen in refer by his primary care physician Dr. Woody Sullivan, Patrick Sullivan, for evaluation of right hand paresthesia, initial evaluation was on May 01, 2017  I have reviewed and summarized the referring note, he has past medical history of hyperlipidemia, coronary artery disease, hypertension, type 2 diabetes since 1990s,  insulin-dependent since 2018.  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  REVIEW OF SYSTEMS: Full 14 system review of systems performed and notable only for numbness, restless leg, not enough sleep, joint pain, easy bruising  itching.  ALLERGIES: No Known Allergies  HOME MEDICATIONS: Current Outpatient Medications  Medication Sig Dispense Refill  . amLODipine (NORVASC) 5 MG tablet TAKE ONE & ONE-HALF TABLETS BY MOUTH ONCE DAILY 45 tablet 6  . aspirin 325 MG tablet Take 162 mg by mouth 2 (two) times daily.    . benazepril (LOTENSIN) 40 MG  tablet Take 1 tablet by mouth daily.    . Blood Glucose Monitoring Suppl ([place]) KIT by Does not apply route.    . Garlic 5974 MG TABS Take 1 tablet by mouth daily.    Marland Kitchen gemfibrozil (LOPID) 600 MG tablet Take 600 mg by mouth 2 (two) times daily before a meal.    . glipiZIDE (GLUCOTROL XL) 5 MG 24 hr tablet Take 5 mg by mouth daily with breakfast.    . Insulin Pen Needle 32G X 6 MM MISC by Does not apply route.    . lovastatin (MEVACOR) 20 MG tablet Take 20 mg by mouth every evening.     . metFORMIN (GLUCOPHAGE) 500 MG tablet Take 500 mg by mouth 2 (two) times daily with a meal.     . Multiple Vitamin (MULTIVITAMIN) tablet Take 1 tablet by mouth daily.     . nitroGLYCERIN (NITROSTAT) 0.4 MG SL tablet Place 1 tablet (0.4 mg total) under the tongue every 5 (five) minutes as needed for chest pain. 30 tablet 12  . saw palmetto 500 MG capsule Take 500 mg by mouth daily.     . sitaGLIPtin-metformin (JANUMET) 50-1000 MG per tablet Take 1 tablet by mouth 2 (two) times daily with a meal.     No current facility-administered medications for this visit.     PAST MEDICAL HISTORY: Past Medical History:  Diagnosis Date  . Atrial fibrillation ([place])   . CAD (coronary artery disease)  Multivessel/left main disease  . Carpal tunnel syndrome   . Essential hypertension   . Ischemic cardiomyopathy    LVEF 35-40%  . Mixed hyperlipidemia   . Type 2 diabetes mellitus ([place])     PAST SURGICAL HISTORY: Past Surgical History:  Procedure Laterality Date  . CARDIAC CATHETERIZATION N/A 02/05/2015   Procedure: Left Heart Cath and Coronary Angiography;  Surgeon: Belva Crome, MD;  Location: [facility];  Service: Cardiovascular;  Laterality: N/A;  . CHOLECYSTECTOMY  08/04/2014    FAMILY HISTORY: Family History  Problem Relation Age of Onset  . Heart attack Father   . Bone cancer Son   . Other Mother        unsure of medical history or cause of death    SOCIAL HISTORY:  Social  History   Socioeconomic History  . Marital status: Single    Spouse name: Not on file  . Number of children: 2  . Years of education: 85  . Highest education level: High school graduate  Occupational History  . Occupation: Retired  Scientific laboratory technician  . Financial resource strain: Not on file  . Food insecurity:    Worry: Not on file    Inability: Not on file  . Transportation needs:    Medical: Not on file    Non-medical: Not on file  Tobacco Use  . Smoking status: Former Smoker    Packs/day: 1.00    Years: 30.00    Pack years: 30.00    Types: Cigarettes    Start date: 07/16/1954    Last attempt to quit: 08/30/1984    Years since quitting: 32.6  . Smokeless tobacco: Never Used  Substance and Sexual Activity  . Alcohol use: No    Alcohol/week: 0.0 oz  . Drug use: No  . Sexual activity: Not on file  Lifestyle  . Physical activity:    Days per week: Not on file    Minutes per session: Not on file  . Stress: Not on file  Relationships  . Social connections:    Talks on phone: Not on file    Gets together: Not on file    Attends religious service: Not on file    Active member of club or organization: Not on file    Attends meetings of clubs or organizations: Not on file    Relationship status: Not on file  . Intimate partner violence:    Fear of current or ex partner: Not on file    Emotionally abused: Not on file    Physically abused: Not on file    Forced sexual activity: Not on file  Other Topics Concern  . Not on file  Social History Narrative   Lives at home alone.   Right-handed.   2-4 cups coffee per day.     PHYSICAL EXAM   Vitals:   05/01/17 1418  BP: (!) 162/75  Pulse: 65  Weight: 190 lb 8 oz (86.4 kg)  Height: '5\' 9"'  (1.753 m)    Not recorded      Body mass index is 28.13 kg/m.  PHYSICAL EXAMNIATION:  Gen: NAD, conversant, well nourised, obese, well groomed                     Cardiovascular: Regular rate rhythm, no peripheral edema, warm,  nontender. Eyes: Conjunctivae clear without exudates or hemorrhage Neck: Supple, no carotid bruits. Pulmonary: Clear to auscultation bilaterally   NEUROLOGICAL EXAM:  MENTAL STATUS: Speech:  Speech is normal; fluent and spontaneous with normal comprehension.  Cognition:     Orientation to time, place and person     Normal recent and remote memory     Normal Attention span and concentration     Normal Language, naming, repeating,spontaneous speech     Fund of knowledge   CRANIAL NERVES: CN II: Visual fields are full to confrontation. Fundoscopic exam is normal with sharp discs and no vascular changes. Pupils are round equal and briskly reactive to light. CN III, IV, VI: extraocular movement are normal. No ptosis. CN V: Facial sensation is intact to pinprick in all 3 divisions bilaterally. Corneal responses are intact.  CN VII: Face is symmetric with normal eye closure and smile. CN VIII: Hearing is normal to rubbing fingers CN IX, X: Palate elevates symmetrically. Phonation is normal. CN XI: Head turning and shoulder shrug are intact CN XII: Tongue is midline with normal movements and no atrophy.  MOTOR: Significant atrophy of right abductor pollicis brevis, moderate weakness on the right side, no significant weakness on the left APB, opponents  REFLEXES: Reflexes are 2+ and symmetric at the biceps, triceps, knees, and ankles. Plantar responses are flexor.  SENSORY: Decreased to pinprick in the first 3 finger pads, bilateral wrist Tinel signs, worse on the right side  COORDINATION: Rapid alternating movements and fine finger movements are intact. There is no dysmetria on finger-to-nose and heel-knee-shin.    GAIT/STANCE: Posture is normal. Gait is steady with normal steps, base, arm swing, and turning. Heel and toe walking are normal. Tandem gait is normal.  Romberg is absent.   DIAGNOSTIC DATA (LABS, IMAGING, TESTING) - I reviewed patient records, labs, notes, testing  and imaging myself where available.   ASSESSMENT AND PLAN  Patrick Sullivan is a 77 y.o. male   Bilateral carpal tunnel syndromes  With evidence of right abductor pollicis brevis atrophy  Laboratory evaluations for etiology  EMG nerve conduction study   Patrick Sullivan, M.D. Ph.D.  [facility] [address] [place], [place] [address] Ph: [phone] Fax: [phone]  CC: Patrick Chroman, MD

## 2017-05-03 ENCOUNTER — Telehealth: Payer: Self-pay | Admitting: *Deleted

## 2017-05-03 LAB — PROTEIN ELECTROPHORESIS
A/G RATIO SPE: 1 (ref 0.7–1.7)
Albumin ELP: 3.7 g/dL (ref 2.9–4.4)
Alpha 1: 0.2 g/dL (ref 0.0–0.4)
Alpha 2: 1.1 g/dL — ABNORMAL HIGH (ref 0.4–1.0)
BETA: 1.5 g/dL — AB (ref 0.7–1.3)
GLOBULIN, TOTAL: 3.6 g/dL (ref 2.2–3.9)
Gamma Globulin: 0.8 g/dL (ref 0.4–1.8)
Total Protein: 7.3 g/dL (ref 6.0–8.5)

## 2017-05-03 LAB — C-REACTIVE PROTEIN: CRP: 0.6 mg/L (ref 0.0–4.9)

## 2017-05-03 LAB — VITAMIN B12: Vitamin B-12: 576 pg/mL (ref 232–1245)

## 2017-05-03 LAB — SEDIMENTATION RATE: SED RATE: 9 mm/h (ref 0–30)

## 2017-05-03 LAB — ANA W/REFLEX IF POSITIVE: Anti Nuclear Antibody(ANA): NEGATIVE

## 2017-05-03 NOTE — Telephone Encounter (Signed)
-----   Message from Levert FeinsteinYijun Yan, MD sent at 05/03/2017  4:46 PM EDT ----- Please call patient for no significant abnormality on lab evaluations

## 2017-05-03 NOTE — Telephone Encounter (Signed)
Spoke to patient - he is aware of lab results. 

## 2017-06-08 ENCOUNTER — Ambulatory Visit (INDEPENDENT_AMBULATORY_CARE_PROVIDER_SITE_OTHER): Payer: Medicare HMO | Admitting: Neurology

## 2017-06-08 ENCOUNTER — Ambulatory Visit: Payer: Medicare HMO | Admitting: Neurology

## 2017-06-08 DIAGNOSIS — G5603 Carpal tunnel syndrome, bilateral upper limbs: Secondary | ICD-10-CM

## 2017-06-08 DIAGNOSIS — R202 Paresthesia of skin: Secondary | ICD-10-CM

## 2017-06-08 DIAGNOSIS — E1142 Type 2 diabetes mellitus with diabetic polyneuropathy: Secondary | ICD-10-CM

## 2017-06-08 DIAGNOSIS — G5601 Carpal tunnel syndrome, right upper limb: Secondary | ICD-10-CM

## 2017-06-08 NOTE — Procedures (Signed)
Full Name: Patrick Sullivan Gender: Male MRN #: [id number] Date of Birth: 1941/01/27    Visit Date: 06/08/2017 10:57 Age: 77 Years 10 Months Old Examining Physician: Levert Feinstein, MD  Referring Physician: Terrace Arabia, MD History: 77 year old male, with history of  diabetic peripheral neuropathy, presented with bilateral hands paresthesia, right hand muscle atrophy  Summary of the tests:  Nerve conduction study: Right sural sensory response was absent.  Right tibial motor responses showed prolonged distal latency, with significantly decreased the C map amplitude and slow conduction velocity.  Bilateral median sensory responses were absent.  Right median motor responses were absent.  Left median motor responses showed moderately prolonged distal latency with normal C map amplitude, there was mild slow conduction velocity.  Bilateral ulnar motor responses showed mildly prolonged distal latency, with normal C map amplitude, mild slow conduction velocity, left ulnar nerve also had 5 m/s velocity drop with no C map amplitude drop across left elbow.  Electromyography: Selective needle examinations of right upper and left upper extremity muscles were performed.  The most abnormality was noted at bilateral abductor pollicis brevis, on the right side, there was increased insertional activity, significantly decreased number of motor unit potential, enlarged, complex, with decreased recruitment patterns; on the left side, the incision activity was normal, mild enlarged motor unit potential with mildly decreased recruitment patterns.  Conclusion:  This is an abnormal study.  There is evidence of bilateral median neuropathy across the wrist, consistent with bilateral carpal tunnel syndromes, right side is severe, left side is moderate.   ------------------------------- Levert Feinstein, M.D.  [facility] [address] [place], [place] [address] Tel: [phone] Fax: [phone]          [facility]    Nerve / Sites Muscle Latency Ref. Amplitude Ref. Rel Amp Segments Distance Velocity Ref. Area    ms ms mV mV %  cm m/s m/s mVms  R Median - APB     Wrist APB NR ?4.4 NR ?4.0 NR Wrist - APB 7   NR  L Median - APB     Wrist APB 6.4 ?4.4 6.2 ?4.0 100 Wrist - APB 7   24.3     Upper arm APB 12.0  5.8  93.5 Upper arm - Wrist 23 41 ?49 23.3  R Ulnar - ADM     Wrist ADM 3.9 ?3.3 12.3 ?6.0 100 Wrist - ADM 7   31.9     B.Elbow ADM 7.9  10.7  87.1 B.Elbow - Wrist 19 47 ?49 29.0     A.Elbow ADM 10.1  9.6  89 A.Elbow - B.Elbow 10 46 ?49 26.6         A.Elbow - Wrist      L Ulnar - ADM     Wrist ADM 3.6 ?3.3 11.4 ?6.0 100 Wrist - ADM 7   29.9     B.Elbow ADM 8.0  10.2  90 B.Elbow - Wrist 19 43 ?49 27.8     A.Elbow ADM 10.6  9.3  91.1 A.Elbow - B.Elbow 10 38 ?49 26.3         A.Elbow - Wrist      R Tibial - AH     Ankle AH 6.8 ?5.8 0.7 ?4.0 100 Ankle - AH 9   2.2     Pop fossa AH 18.6  0.6  84.3 Pop fossa - Ankle 38 32 ?41 0.2  SNC    Nerve / Sites Rec. Site Peak Lat Ref.  Amp Ref. Segments Distance    ms ms V V  cm  R Radial - Anatomical snuff box (Forearm)     Forearm Wrist 3.6 ?2.9 7 ?15 Forearm - Wrist 10  L Radial - Anatomical snuff box (Forearm)     Forearm Wrist 3.2 ?2.9 7 ?15 Forearm - Wrist 10  R Sural - Ankle (Calf)     Calf Ankle NR ?4.4 NR ?6 Calf - Ankle 14  R Median - Orthodromic (Dig II, Mid palm)     Dig II Wrist NR ?3.4 NR ?10 Dig II - Wrist 13  L Median - Orthodromic (Dig II, Mid palm)     Dig II Wrist NR ?3.4 NR ?10 Dig II - Wrist 13  R Ulnar - Orthodromic, (Dig V, Mid palm)     Dig V Wrist 3.4 ?3.1 4 ?5 Dig V - Wrist 11  L Ulnar - Orthodromic, (Dig V, Mid palm)     Dig V Wrist 3.4 ?3.1 3 ?5 Dig V - Wrist 81                   F  Wave    Nerve F Lat Ref.   ms ms  R Ulnar - ADM 34.3 ?32.0  L Ulnar - ADM 34.3 ?32.0  R Tibial - AH 65.7 ?56.0           EMG full       EMG Summary Table    Spontaneous MUAP Recruitment  Muscle IA Fib PSW Fasc  Other Amp Dur. Poly Pattern  R. Abductor pollicis brevis Increased 1+ None None _______ Increased Increased 1+ Reduced  R. First dorsal interosseous Normal None None None _______ Normal Normal Normal Normal  R. Abductor digiti minimi (manus) Normal None None None _______ Normal Normal Normal Normal  R. Pronator quadratus Normal None None None _______ Normal Normal Normal Normal  R. Extensor digitorum communis Normal None None None _______ Normal Normal Normal Normal  R. Deltoid Normal None None None _______ Normal Normal Normal Normal  R. Biceps brachii Normal None None None _______ Normal Normal Normal Normal  L. Abductor pollicis brevis Normal None None None _______ Increased Normal Normal Reduced  L. First dorsal interosseous Normal None None None _______ Normal Normal Normal Normal

## 2017-06-08 NOTE — Progress Notes (Signed)
PATIENT: Patrick Sullivan DOB: July 09, 1940  No chief complaint on file.    HISTORICAL  Patrick Sullivan is a 77 year old male, seen in refer by his primary care physician Dr. 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  UPDATE Jun 08 2017: Laboratory evaluation seen April 2019 showed normal or negative ESR, C-reactive protein, ANA, B12, there was no significant abnormality on protein electrophoresis.  Electrodiagnostic study today showed evidence of mild axonal sensorimotor polyneuropathy, there is also evidence of bilateral carpal tunnel syndromes, right side is severe, left-sided moderate,  I have referred him to neurosurgeon Dr. Fredna Dow  REVIEW OF SYSTEMS: Full 14 system review of systems performed and notable only for numbness, restless leg, not enough sleep, joint pain, easy bruising  itching.  ALLERGIES: No Known Allergies  HOME MEDICATIONS: Current Outpatient Medications  Medication Sig Dispense Refill  . amLODipine (NORVASC) 5 MG tablet TAKE ONE & ONE-HALF TABLETS BY MOUTH ONCE  DAILY 45 tablet 6  . aspirin 325 MG tablet Take 162 mg by mouth 2 (two) times daily.    . benazepril (LOTENSIN) 40 MG tablet Take 1 tablet by mouth daily.    . Blood Glucose Monitoring Suppl ([place]) KIT by Does not apply route.    . Garlic 3557 MG TABS Take 1 tablet by mouth daily.    Marland Kitchen gemfibrozil (LOPID) 600 MG tablet Take 600 mg by mouth 2 (two) times daily before a meal.    . glipiZIDE (GLUCOTROL XL) 5 MG 24 hr tablet Take 5 mg by mouth daily with breakfast.    . Insulin Pen Needle 32G X 6 MM MISC by Does not apply route.    . lovastatin (MEVACOR) 20 MG tablet Take 20 mg by mouth every evening.     . metFORMIN (GLUCOPHAGE) 500 MG tablet Take 500 mg by mouth 2 (two) times daily with a meal.     . Multiple Vitamin (MULTIVITAMIN) tablet Take 1 tablet by mouth daily.     . nitroGLYCERIN (NITROSTAT) 0.4 MG SL tablet Place 1 tablet (0.4 mg total) under the tongue every 5 (five) minutes as needed for chest pain. 30 tablet 12  . saw palmetto 500 MG capsule Take 500 mg by mouth daily.     . sitaGLIPtin-metformin (JANUMET) 50-1000 MG per tablet Take 1 tablet by mouth 2 (two) times daily with a meal.     No current facility-administered medications for this visit.     PAST MEDICAL HISTORY: Past Medical History:  Diagnosis Date  . Atrial fibrillation ([place])   . CAD (coronary artery disease)    Multivessel/left main disease  . Carpal tunnel  syndrome   . Essential hypertension   . Ischemic cardiomyopathy    LVEF 35-40%  . Mixed hyperlipidemia   . Type 2 diabetes mellitus ([place])     PAST SURGICAL HISTORY: Past Surgical History:  Procedure Laterality Date  . CARDIAC CATHETERIZATION N/A 02/05/2015   Procedure: Left Heart Cath and Coronary Angiography;  Surgeon: Belva Crome, MD;  Location: [facility];  Service: Cardiovascular;  Laterality: N/A;  . CHOLECYSTECTOMY  08/04/2014    FAMILY HISTORY: Family History  Problem Relation Age of Onset  . Heart attack Father   .  Bone cancer Son   . Other Mother        unsure of medical history or cause of death    SOCIAL HISTORY:  Social History   Socioeconomic History  . Marital status: Single    Spouse name: Not on file  . Number of children: 2  . Years of education: 63  . Highest education level: High school graduate  Occupational History  . Occupation: Retired  Scientific laboratory technician  . Financial resource strain: Not on file  . Food insecurity:    Worry: Not on file    Inability: Not on file  . Transportation needs:    Medical: Not on file    Non-medical: Not on file  Tobacco Use  . Smoking status: Former Smoker    Packs/day: 1.00    Years: 30.00    Pack years: 30.00    Types: Cigarettes    Start date: 07/16/1954    Last attempt to quit: 08/30/1984    Years since quitting: 32.7  . Smokeless tobacco: Never Used  Substance and Sexual Activity  . Alcohol use: No    Alcohol/week: 0.0 oz  . Drug use: No  . Sexual activity: Not on file  Lifestyle  . Physical activity:    Days per week: Not on file    Minutes per session: Not on file  . Stress: Not on file  Relationships  . Social connections:    Talks on phone: Not on file    Gets together: Not on file    Attends religious service: Not on file    Active member of club or organization: Not on file    Attends meetings of clubs or organizations: Not on file    Relationship status: Not on file  . Intimate partner violence:    Fear of current or ex partner: Not on file    Emotionally abused: Not on file    Physically abused: Not on file    Forced sexual activity: Not on file  Other Topics Concern  . Not on file  Social History Narrative   Lives at home alone.   Right-handed.   2-4 cups coffee per day.     PHYSICAL EXAM   There were no vitals filed for this visit.  Not recorded      There is no height or weight on file to calculate BMI.  PHYSICAL EXAMNIATION:  Gen: NAD, conversant, well nourised, obese, well groomed                      Cardiovascular: Regular rate rhythm, no peripheral edema, warm, nontender. Eyes: Conjunctivae clear without exudates or hemorrhage Neck: Supple, no carotid bruits. Pulmonary: Clear to auscultation bilaterally   NEUROLOGICAL EXAM:  MENTAL STATUS: Speech:    Speech is normal; fluent and spontaneous with normal comprehension.  Cognition:     Orientation to time, place and person  Normal recent and remote memory     Normal Attention span and concentration     Normal Language, naming, repeating,spontaneous speech     Fund of knowledge   CRANIAL NERVES: CN II: Visual fields are full to confrontation. Fundoscopic exam is normal with sharp discs and no vascular changes. Pupils are round equal and briskly reactive to light. CN III, IV, VI: extraocular movement are normal. No ptosis. CN V: Facial sensation is intact to pinprick in all 3 divisions bilaterally. Corneal responses are intact.  CN VII: Face is symmetric with normal eye closure and smile. CN VIII: Hearing is normal to rubbing fingers CN IX, X: Palate elevates symmetrically. Phonation is normal. CN XI: Head turning and shoulder shrug are intact CN XII: Tongue is midline with normal movements and no atrophy.  MOTOR: Significant atrophy of right abductor pollicis brevis, moderate weakness on the right side, no significant weakness on the left APB, opponents  REFLEXES: Reflexes are 2+ and symmetric at the biceps, triceps, knees, and ankles. Plantar responses are flexor.  SENSORY: Decreased to pinprick in the first 3 finger pads, bilateral wrist Tinel signs, worse on the right side  COORDINATION: Rapid alternating movements and fine finger movements are intact. There is no dysmetria on finger-to-nose and heel-knee-shin.    GAIT/STANCE: Posture is normal. Gait is steady with normal steps, base, arm swing, and turning. Heel and toe walking are normal. Tandem gait is normal.  Romberg is absent.   DIAGNOSTIC DATA (LABS,  IMAGING, TESTING) - I reviewed patient records, labs, notes, testing and imaging myself where available.   ASSESSMENT AND PLAN  Patrick Sullivan is a 77 y.o. 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 Ph.D.  [facility] [address] [place], [place] [address] Ph: [phone] Fax: [phone]  CC: Glenda Chroman, MD

## 2018-01-16 ENCOUNTER — Other Ambulatory Visit (HOSPITAL_COMMUNITY): Payer: Self-pay | Admitting: Internal Medicine

## 2018-01-16 DIAGNOSIS — R42 Dizziness and giddiness: Secondary | ICD-10-CM

## 2018-01-16 DIAGNOSIS — R609 Edema, unspecified: Secondary | ICD-10-CM

## 2018-01-18 ENCOUNTER — Encounter (HOSPITAL_COMMUNITY): Payer: Self-pay

## 2018-01-18 ENCOUNTER — Other Ambulatory Visit (HOSPITAL_COMMUNITY): Payer: Self-pay | Admitting: Internal Medicine

## 2018-01-18 ENCOUNTER — Ambulatory Visit (HOSPITAL_COMMUNITY)
Admission: RE | Admit: 2018-01-18 | Discharge: 2018-01-18 | Disposition: A | Discharge status: Home / Self Care | Payer: Medicare HMO | Source: Ambulatory Visit | Attending: Internal Medicine | Admitting: Internal Medicine

## 2018-01-18 DIAGNOSIS — I6529 Occlusion and stenosis of unspecified carotid artery: Secondary | ICD-10-CM | POA: Diagnosis present

## 2018-01-18 DIAGNOSIS — W19XXXA Unspecified fall, initial encounter: Secondary | ICD-10-CM

## 2018-01-18 DIAGNOSIS — R600 Localized edema: Secondary | ICD-10-CM | POA: Insufficient documentation

## 2018-01-18 DIAGNOSIS — R609 Edema, unspecified: Secondary | ICD-10-CM

## 2018-01-18 DIAGNOSIS — R42 Dizziness and giddiness: Secondary | ICD-10-CM | POA: Diagnosis present

## 2018-01-18 DIAGNOSIS — I6523 Occlusion and stenosis of bilateral carotid arteries: Secondary | ICD-10-CM | POA: Insufficient documentation

## 2019-02-28 DIAGNOSIS — E1165 Type 2 diabetes mellitus with hyperglycemia: Secondary | ICD-10-CM | POA: Diagnosis not present

## 2019-02-28 DIAGNOSIS — I10 Essential (primary) hypertension: Secondary | ICD-10-CM | POA: Diagnosis not present

## 2019-02-28 DIAGNOSIS — Z299 Encounter for prophylactic measures, unspecified: Secondary | ICD-10-CM | POA: Diagnosis not present

## 2019-02-28 DIAGNOSIS — I4891 Unspecified atrial fibrillation: Secondary | ICD-10-CM | POA: Diagnosis not present

## 2019-02-28 DIAGNOSIS — I5032 Chronic diastolic (congestive) heart failure: Secondary | ICD-10-CM | POA: Diagnosis not present

## 2019-05-09 DIAGNOSIS — E1165 Type 2 diabetes mellitus with hyperglycemia: Secondary | ICD-10-CM | POA: Diagnosis not present

## 2019-05-09 DIAGNOSIS — I10 Essential (primary) hypertension: Secondary | ICD-10-CM | POA: Diagnosis not present

## 2019-05-09 DIAGNOSIS — R1031 Right lower quadrant pain: Secondary | ICD-10-CM | POA: Diagnosis not present

## 2019-05-09 DIAGNOSIS — Z299 Encounter for prophylactic measures, unspecified: Secondary | ICD-10-CM | POA: Diagnosis not present

## 2019-05-09 DIAGNOSIS — I4891 Unspecified atrial fibrillation: Secondary | ICD-10-CM | POA: Diagnosis not present

## 2019-05-09 DIAGNOSIS — R5383 Other fatigue: Secondary | ICD-10-CM | POA: Diagnosis not present

## 2019-05-20 DIAGNOSIS — W19XXXA Unspecified fall, initial encounter: Secondary | ICD-10-CM | POA: Diagnosis not present

## 2019-05-20 DIAGNOSIS — S0990XA Unspecified injury of head, initial encounter: Secondary | ICD-10-CM | POA: Diagnosis not present

## 2019-05-20 DIAGNOSIS — S299XXA Unspecified injury of thorax, initial encounter: Secondary | ICD-10-CM | POA: Diagnosis not present

## 2019-05-20 DIAGNOSIS — E875 Hyperkalemia: Secondary | ICD-10-CM | POA: Diagnosis not present

## 2019-05-20 DIAGNOSIS — R739 Hyperglycemia, unspecified: Secondary | ICD-10-CM | POA: Diagnosis not present

## 2019-05-20 DIAGNOSIS — S3993XA Unspecified injury of pelvis, initial encounter: Secondary | ICD-10-CM | POA: Diagnosis not present

## 2019-05-20 DIAGNOSIS — N179 Acute kidney failure, unspecified: Secondary | ICD-10-CM | POA: Diagnosis not present

## 2019-05-20 DIAGNOSIS — S199XXA Unspecified injury of neck, initial encounter: Secondary | ICD-10-CM | POA: Diagnosis not present

## 2019-05-20 DIAGNOSIS — M6282 Rhabdomyolysis: Secondary | ICD-10-CM | POA: Diagnosis not present

## 2019-05-20 DIAGNOSIS — E1165 Type 2 diabetes mellitus with hyperglycemia: Secondary | ICD-10-CM | POA: Diagnosis not present

## 2019-05-21 DIAGNOSIS — S199XXA Unspecified injury of neck, initial encounter: Secondary | ICD-10-CM | POA: Diagnosis not present

## 2019-05-21 DIAGNOSIS — R5381 Other malaise: Secondary | ICD-10-CM | POA: Diagnosis not present

## 2019-05-21 DIAGNOSIS — R109 Unspecified abdominal pain: Secondary | ICD-10-CM | POA: Diagnosis not present

## 2019-05-21 DIAGNOSIS — S3993XA Unspecified injury of pelvis, initial encounter: Secondary | ICD-10-CM | POA: Diagnosis not present

## 2019-05-21 DIAGNOSIS — I252 Old myocardial infarction: Secondary | ICD-10-CM | POA: Diagnosis not present

## 2019-05-21 DIAGNOSIS — M6282 Rhabdomyolysis: Secondary | ICD-10-CM | POA: Diagnosis not present

## 2019-05-21 DIAGNOSIS — Z87891 Personal history of nicotine dependence: Secondary | ICD-10-CM | POA: Diagnosis not present

## 2019-05-21 DIAGNOSIS — T796XXA Traumatic ischemia of muscle, initial encounter: Secondary | ICD-10-CM | POA: Diagnosis not present

## 2019-05-21 DIAGNOSIS — R918 Other nonspecific abnormal finding of lung field: Secondary | ICD-10-CM | POA: Diagnosis not present

## 2019-05-21 DIAGNOSIS — E1165 Type 2 diabetes mellitus with hyperglycemia: Secondary | ICD-10-CM | POA: Diagnosis not present

## 2019-05-21 DIAGNOSIS — I2510 Atherosclerotic heart disease of native coronary artery without angina pectoris: Secondary | ICD-10-CM | POA: Diagnosis not present

## 2019-05-21 DIAGNOSIS — Z7901 Long term (current) use of anticoagulants: Secondary | ICD-10-CM | POA: Diagnosis not present

## 2019-05-21 DIAGNOSIS — E7800 Pure hypercholesterolemia, unspecified: Secondary | ICD-10-CM | POA: Diagnosis not present

## 2019-05-21 DIAGNOSIS — M6281 Muscle weakness (generalized): Secondary | ICD-10-CM | POA: Diagnosis not present

## 2019-05-21 DIAGNOSIS — E875 Hyperkalemia: Secondary | ICD-10-CM | POA: Diagnosis not present

## 2019-05-21 DIAGNOSIS — S0990XA Unspecified injury of head, initial encounter: Secondary | ICD-10-CM | POA: Diagnosis not present

## 2019-05-21 DIAGNOSIS — Z20822 Contact with and (suspected) exposure to covid-19: Secondary | ICD-10-CM | POA: Diagnosis not present

## 2019-05-21 DIAGNOSIS — I110 Hypertensive heart disease with heart failure: Secondary | ICD-10-CM | POA: Diagnosis not present

## 2019-05-21 DIAGNOSIS — S299XXA Unspecified injury of thorax, initial encounter: Secondary | ICD-10-CM | POA: Diagnosis not present

## 2019-05-21 DIAGNOSIS — Z7984 Long term (current) use of oral hypoglycemic drugs: Secondary | ICD-10-CM | POA: Diagnosis not present

## 2019-05-21 DIAGNOSIS — R2689 Other abnormalities of gait and mobility: Secondary | ICD-10-CM | POA: Diagnosis not present

## 2019-05-21 DIAGNOSIS — R69 Illness, unspecified: Secondary | ICD-10-CM | POA: Diagnosis not present

## 2019-05-21 DIAGNOSIS — E860 Dehydration: Secondary | ICD-10-CM | POA: Diagnosis not present

## 2019-05-21 DIAGNOSIS — Z9181 History of falling: Secondary | ICD-10-CM | POA: Diagnosis not present

## 2019-05-21 DIAGNOSIS — N179 Acute kidney failure, unspecified: Secondary | ICD-10-CM | POA: Diagnosis not present

## 2019-05-21 DIAGNOSIS — I482 Chronic atrial fibrillation, unspecified: Secondary | ICD-10-CM | POA: Diagnosis not present

## 2019-05-21 DIAGNOSIS — I5023 Acute on chronic systolic (congestive) heart failure: Secondary | ICD-10-CM | POA: Diagnosis not present

## 2019-05-21 DIAGNOSIS — R739 Hyperglycemia, unspecified: Secondary | ICD-10-CM | POA: Diagnosis not present

## 2019-05-21 DIAGNOSIS — Z743 Need for continuous supervision: Secondary | ICD-10-CM | POA: Diagnosis not present

## 2019-05-21 DIAGNOSIS — Z7982 Long term (current) use of aspirin: Secondary | ICD-10-CM | POA: Diagnosis not present

## 2019-05-27 DIAGNOSIS — R2689 Other abnormalities of gait and mobility: Secondary | ICD-10-CM | POA: Diagnosis not present

## 2019-05-27 DIAGNOSIS — Z9181 History of falling: Secondary | ICD-10-CM | POA: Diagnosis not present

## 2019-05-27 DIAGNOSIS — R69 Illness, unspecified: Secondary | ICD-10-CM | POA: Diagnosis not present

## 2019-05-27 DIAGNOSIS — E1165 Type 2 diabetes mellitus with hyperglycemia: Secondary | ICD-10-CM | POA: Diagnosis not present

## 2019-05-27 DIAGNOSIS — M6281 Muscle weakness (generalized): Secondary | ICD-10-CM | POA: Diagnosis not present

## 2019-05-27 DIAGNOSIS — W19XXXA Unspecified fall, initial encounter: Secondary | ICD-10-CM | POA: Diagnosis not present

## 2019-05-27 DIAGNOSIS — T796XXA Traumatic ischemia of muscle, initial encounter: Secondary | ICD-10-CM | POA: Diagnosis not present

## 2019-05-27 DIAGNOSIS — I509 Heart failure, unspecified: Secondary | ICD-10-CM | POA: Diagnosis not present

## 2019-05-27 DIAGNOSIS — R5383 Other fatigue: Secondary | ICD-10-CM | POA: Diagnosis not present

## 2019-05-27 DIAGNOSIS — I5023 Acute on chronic systolic (congestive) heart failure: Secondary | ICD-10-CM | POA: Diagnosis not present

## 2019-05-27 DIAGNOSIS — I482 Chronic atrial fibrillation, unspecified: Secondary | ICD-10-CM | POA: Diagnosis not present

## 2019-05-27 DIAGNOSIS — Z743 Need for continuous supervision: Secondary | ICD-10-CM | POA: Diagnosis not present

## 2019-05-27 DIAGNOSIS — M6282 Rhabdomyolysis: Secondary | ICD-10-CM | POA: Diagnosis not present

## 2019-05-27 DIAGNOSIS — R5381 Other malaise: Secondary | ICD-10-CM | POA: Diagnosis not present

## 2019-05-29 DIAGNOSIS — I482 Chronic atrial fibrillation, unspecified: Secondary | ICD-10-CM | POA: Diagnosis not present

## 2019-05-29 DIAGNOSIS — M6282 Rhabdomyolysis: Secondary | ICD-10-CM | POA: Diagnosis not present

## 2019-05-29 DIAGNOSIS — I5023 Acute on chronic systolic (congestive) heart failure: Secondary | ICD-10-CM | POA: Diagnosis not present

## 2019-05-29 DIAGNOSIS — E1165 Type 2 diabetes mellitus with hyperglycemia: Secondary | ICD-10-CM | POA: Diagnosis not present

## 2019-06-04 DIAGNOSIS — W19XXXA Unspecified fall, initial encounter: Secondary | ICD-10-CM | POA: Diagnosis not present

## 2019-06-04 DIAGNOSIS — I509 Heart failure, unspecified: Secondary | ICD-10-CM | POA: Diagnosis not present

## 2019-06-04 DIAGNOSIS — R5383 Other fatigue: Secondary | ICD-10-CM | POA: Diagnosis not present

## 2019-06-04 DIAGNOSIS — M6282 Rhabdomyolysis: Secondary | ICD-10-CM | POA: Diagnosis not present

## 2019-06-06 DIAGNOSIS — I5023 Acute on chronic systolic (congestive) heart failure: Secondary | ICD-10-CM | POA: Diagnosis not present

## 2019-06-06 DIAGNOSIS — I2510 Atherosclerotic heart disease of native coronary artery without angina pectoris: Secondary | ICD-10-CM | POA: Diagnosis not present

## 2019-06-06 DIAGNOSIS — I252 Old myocardial infarction: Secondary | ICD-10-CM | POA: Diagnosis not present

## 2019-06-06 DIAGNOSIS — I110 Hypertensive heart disease with heart failure: Secondary | ICD-10-CM | POA: Diagnosis not present

## 2019-06-06 DIAGNOSIS — I482 Chronic atrial fibrillation, unspecified: Secondary | ICD-10-CM | POA: Diagnosis not present

## 2019-06-10 DIAGNOSIS — I5023 Acute on chronic systolic (congestive) heart failure: Secondary | ICD-10-CM | POA: Diagnosis not present

## 2019-06-10 DIAGNOSIS — I252 Old myocardial infarction: Secondary | ICD-10-CM | POA: Diagnosis not present

## 2019-06-10 DIAGNOSIS — I110 Hypertensive heart disease with heart failure: Secondary | ICD-10-CM | POA: Diagnosis not present

## 2019-06-10 DIAGNOSIS — I2510 Atherosclerotic heart disease of native coronary artery without angina pectoris: Secondary | ICD-10-CM | POA: Diagnosis not present

## 2019-06-10 DIAGNOSIS — I482 Chronic atrial fibrillation, unspecified: Secondary | ICD-10-CM | POA: Diagnosis not present

## 2019-06-11 DIAGNOSIS — I5023 Acute on chronic systolic (congestive) heart failure: Secondary | ICD-10-CM | POA: Diagnosis not present

## 2019-06-11 DIAGNOSIS — I110 Hypertensive heart disease with heart failure: Secondary | ICD-10-CM | POA: Diagnosis not present

## 2019-06-11 DIAGNOSIS — I482 Chronic atrial fibrillation, unspecified: Secondary | ICD-10-CM | POA: Diagnosis not present

## 2019-06-11 DIAGNOSIS — I252 Old myocardial infarction: Secondary | ICD-10-CM | POA: Diagnosis not present

## 2019-06-11 DIAGNOSIS — I2510 Atherosclerotic heart disease of native coronary artery without angina pectoris: Secondary | ICD-10-CM | POA: Diagnosis not present

## 2019-06-13 DIAGNOSIS — Z299 Encounter for prophylactic measures, unspecified: Secondary | ICD-10-CM | POA: Diagnosis not present

## 2019-06-13 DIAGNOSIS — I4891 Unspecified atrial fibrillation: Secondary | ICD-10-CM | POA: Diagnosis not present

## 2019-06-13 DIAGNOSIS — I5023 Acute on chronic systolic (congestive) heart failure: Secondary | ICD-10-CM | POA: Diagnosis not present

## 2019-06-13 DIAGNOSIS — I2510 Atherosclerotic heart disease of native coronary artery without angina pectoris: Secondary | ICD-10-CM | POA: Diagnosis not present

## 2019-06-13 DIAGNOSIS — E1165 Type 2 diabetes mellitus with hyperglycemia: Secondary | ICD-10-CM | POA: Diagnosis not present

## 2019-06-13 DIAGNOSIS — I482 Chronic atrial fibrillation, unspecified: Secondary | ICD-10-CM | POA: Diagnosis not present

## 2019-06-13 DIAGNOSIS — I252 Old myocardial infarction: Secondary | ICD-10-CM | POA: Diagnosis not present

## 2019-06-13 DIAGNOSIS — I10 Essential (primary) hypertension: Secondary | ICD-10-CM | POA: Diagnosis not present

## 2019-06-13 DIAGNOSIS — I110 Hypertensive heart disease with heart failure: Secondary | ICD-10-CM | POA: Diagnosis not present

## 2019-06-18 DIAGNOSIS — I5023 Acute on chronic systolic (congestive) heart failure: Secondary | ICD-10-CM | POA: Diagnosis not present

## 2019-06-18 DIAGNOSIS — I110 Hypertensive heart disease with heart failure: Secondary | ICD-10-CM | POA: Diagnosis not present

## 2019-06-18 DIAGNOSIS — I482 Chronic atrial fibrillation, unspecified: Secondary | ICD-10-CM | POA: Diagnosis not present

## 2019-06-18 DIAGNOSIS — I2510 Atherosclerotic heart disease of native coronary artery without angina pectoris: Secondary | ICD-10-CM | POA: Diagnosis not present

## 2019-06-18 DIAGNOSIS — I252 Old myocardial infarction: Secondary | ICD-10-CM | POA: Diagnosis not present

## 2019-06-20 DIAGNOSIS — I252 Old myocardial infarction: Secondary | ICD-10-CM | POA: Diagnosis not present

## 2019-06-20 DIAGNOSIS — I2510 Atherosclerotic heart disease of native coronary artery without angina pectoris: Secondary | ICD-10-CM | POA: Diagnosis not present

## 2019-06-20 DIAGNOSIS — I5023 Acute on chronic systolic (congestive) heart failure: Secondary | ICD-10-CM | POA: Diagnosis not present

## 2019-06-20 DIAGNOSIS — I110 Hypertensive heart disease with heart failure: Secondary | ICD-10-CM | POA: Diagnosis not present

## 2019-06-20 DIAGNOSIS — I482 Chronic atrial fibrillation, unspecified: Secondary | ICD-10-CM | POA: Diagnosis not present

## 2019-06-23 DIAGNOSIS — I110 Hypertensive heart disease with heart failure: Secondary | ICD-10-CM | POA: Diagnosis not present

## 2019-06-23 DIAGNOSIS — I252 Old myocardial infarction: Secondary | ICD-10-CM | POA: Diagnosis not present

## 2019-06-23 DIAGNOSIS — I2510 Atherosclerotic heart disease of native coronary artery without angina pectoris: Secondary | ICD-10-CM | POA: Diagnosis not present

## 2019-06-23 DIAGNOSIS — I5023 Acute on chronic systolic (congestive) heart failure: Secondary | ICD-10-CM | POA: Diagnosis not present

## 2019-06-23 DIAGNOSIS — I482 Chronic atrial fibrillation, unspecified: Secondary | ICD-10-CM | POA: Diagnosis not present

## 2019-06-25 DIAGNOSIS — I5023 Acute on chronic systolic (congestive) heart failure: Secondary | ICD-10-CM | POA: Diagnosis not present

## 2019-06-25 DIAGNOSIS — I2510 Atherosclerotic heart disease of native coronary artery without angina pectoris: Secondary | ICD-10-CM | POA: Diagnosis not present

## 2019-06-25 DIAGNOSIS — I110 Hypertensive heart disease with heart failure: Secondary | ICD-10-CM | POA: Diagnosis not present

## 2019-06-25 DIAGNOSIS — I252 Old myocardial infarction: Secondary | ICD-10-CM | POA: Diagnosis not present

## 2019-06-25 DIAGNOSIS — I482 Chronic atrial fibrillation, unspecified: Secondary | ICD-10-CM | POA: Diagnosis not present

## 2019-06-27 DIAGNOSIS — I482 Chronic atrial fibrillation, unspecified: Secondary | ICD-10-CM | POA: Diagnosis not present

## 2019-06-27 DIAGNOSIS — I5023 Acute on chronic systolic (congestive) heart failure: Secondary | ICD-10-CM | POA: Diagnosis not present

## 2019-06-27 DIAGNOSIS — I2510 Atherosclerotic heart disease of native coronary artery without angina pectoris: Secondary | ICD-10-CM | POA: Diagnosis not present

## 2019-06-27 DIAGNOSIS — I110 Hypertensive heart disease with heart failure: Secondary | ICD-10-CM | POA: Diagnosis not present

## 2019-06-27 DIAGNOSIS — I252 Old myocardial infarction: Secondary | ICD-10-CM | POA: Diagnosis not present

## 2019-07-02 DIAGNOSIS — I252 Old myocardial infarction: Secondary | ICD-10-CM | POA: Diagnosis not present

## 2019-07-02 DIAGNOSIS — I482 Chronic atrial fibrillation, unspecified: Secondary | ICD-10-CM | POA: Diagnosis not present

## 2019-07-02 DIAGNOSIS — I110 Hypertensive heart disease with heart failure: Secondary | ICD-10-CM | POA: Diagnosis not present

## 2019-07-02 DIAGNOSIS — I2510 Atherosclerotic heart disease of native coronary artery without angina pectoris: Secondary | ICD-10-CM | POA: Diagnosis not present

## 2019-07-02 DIAGNOSIS — I5023 Acute on chronic systolic (congestive) heart failure: Secondary | ICD-10-CM | POA: Diagnosis not present

## 2019-07-03 DIAGNOSIS — I5023 Acute on chronic systolic (congestive) heart failure: Secondary | ICD-10-CM | POA: Diagnosis not present

## 2019-07-03 DIAGNOSIS — I252 Old myocardial infarction: Secondary | ICD-10-CM | POA: Diagnosis not present

## 2019-07-03 DIAGNOSIS — I482 Chronic atrial fibrillation, unspecified: Secondary | ICD-10-CM | POA: Diagnosis not present

## 2019-07-03 DIAGNOSIS — I2510 Atherosclerotic heart disease of native coronary artery without angina pectoris: Secondary | ICD-10-CM | POA: Diagnosis not present

## 2019-07-03 DIAGNOSIS — I110 Hypertensive heart disease with heart failure: Secondary | ICD-10-CM | POA: Diagnosis not present

## 2019-07-04 DIAGNOSIS — I482 Chronic atrial fibrillation, unspecified: Secondary | ICD-10-CM | POA: Diagnosis not present

## 2019-07-04 DIAGNOSIS — I110 Hypertensive heart disease with heart failure: Secondary | ICD-10-CM | POA: Diagnosis not present

## 2019-07-04 DIAGNOSIS — I2510 Atherosclerotic heart disease of native coronary artery without angina pectoris: Secondary | ICD-10-CM | POA: Diagnosis not present

## 2019-07-04 DIAGNOSIS — I252 Old myocardial infarction: Secondary | ICD-10-CM | POA: Diagnosis not present

## 2019-07-04 DIAGNOSIS — I5023 Acute on chronic systolic (congestive) heart failure: Secondary | ICD-10-CM | POA: Diagnosis not present

## 2019-07-25 DIAGNOSIS — Z7189 Other specified counseling: Secondary | ICD-10-CM | POA: Diagnosis not present

## 2019-07-25 DIAGNOSIS — Z0000 Encounter for general adult medical examination without abnormal findings: Secondary | ICD-10-CM | POA: Diagnosis not present

## 2019-07-25 DIAGNOSIS — Z299 Encounter for prophylactic measures, unspecified: Secondary | ICD-10-CM | POA: Diagnosis not present

## 2019-07-25 DIAGNOSIS — M545 Low back pain: Secondary | ICD-10-CM | POA: Diagnosis not present

## 2019-08-11 DIAGNOSIS — I10 Essential (primary) hypertension: Secondary | ICD-10-CM | POA: Diagnosis not present

## 2019-08-11 DIAGNOSIS — Z794 Long term (current) use of insulin: Secondary | ICD-10-CM | POA: Diagnosis not present

## 2019-08-11 DIAGNOSIS — Z0000 Encounter for general adult medical examination without abnormal findings: Secondary | ICD-10-CM | POA: Diagnosis not present

## 2019-08-11 DIAGNOSIS — E1142 Type 2 diabetes mellitus with diabetic polyneuropathy: Secondary | ICD-10-CM | POA: Diagnosis not present

## 2019-08-11 DIAGNOSIS — E1165 Type 2 diabetes mellitus with hyperglycemia: Secondary | ICD-10-CM | POA: Diagnosis not present

## 2019-08-11 DIAGNOSIS — Z299 Encounter for prophylactic measures, unspecified: Secondary | ICD-10-CM | POA: Diagnosis not present

## 2019-08-11 DIAGNOSIS — I4891 Unspecified atrial fibrillation: Secondary | ICD-10-CM | POA: Diagnosis not present

## 2019-12-11 DIAGNOSIS — E1142 Type 2 diabetes mellitus with diabetic polyneuropathy: Secondary | ICD-10-CM | POA: Diagnosis not present

## 2019-12-11 DIAGNOSIS — I10 Essential (primary) hypertension: Secondary | ICD-10-CM | POA: Diagnosis not present

## 2019-12-11 DIAGNOSIS — Z23 Encounter for immunization: Secondary | ICD-10-CM | POA: Diagnosis not present

## 2019-12-11 DIAGNOSIS — I4891 Unspecified atrial fibrillation: Secondary | ICD-10-CM | POA: Diagnosis not present

## 2019-12-11 DIAGNOSIS — E1165 Type 2 diabetes mellitus with hyperglycemia: Secondary | ICD-10-CM | POA: Diagnosis not present

## 2019-12-11 DIAGNOSIS — I5032 Chronic diastolic (congestive) heart failure: Secondary | ICD-10-CM | POA: Diagnosis not present

## 2019-12-11 DIAGNOSIS — Z299 Encounter for prophylactic measures, unspecified: Secondary | ICD-10-CM | POA: Diagnosis not present

## 2019-12-12 IMAGING — DX DG RIBS W/ CHEST 3+V*L*
5 series · 5 of 5 positions shown · non-contrast
Comparison: PA and lateral chest 01/15/2018.  CT chest 01/28/2015.

CLINICAL DATA: Left rib pain since a fall yesterday. Initial
encounter.

EXAM:
LEFT RIBS AND CHEST - 3+ VIEW

[chest pa]
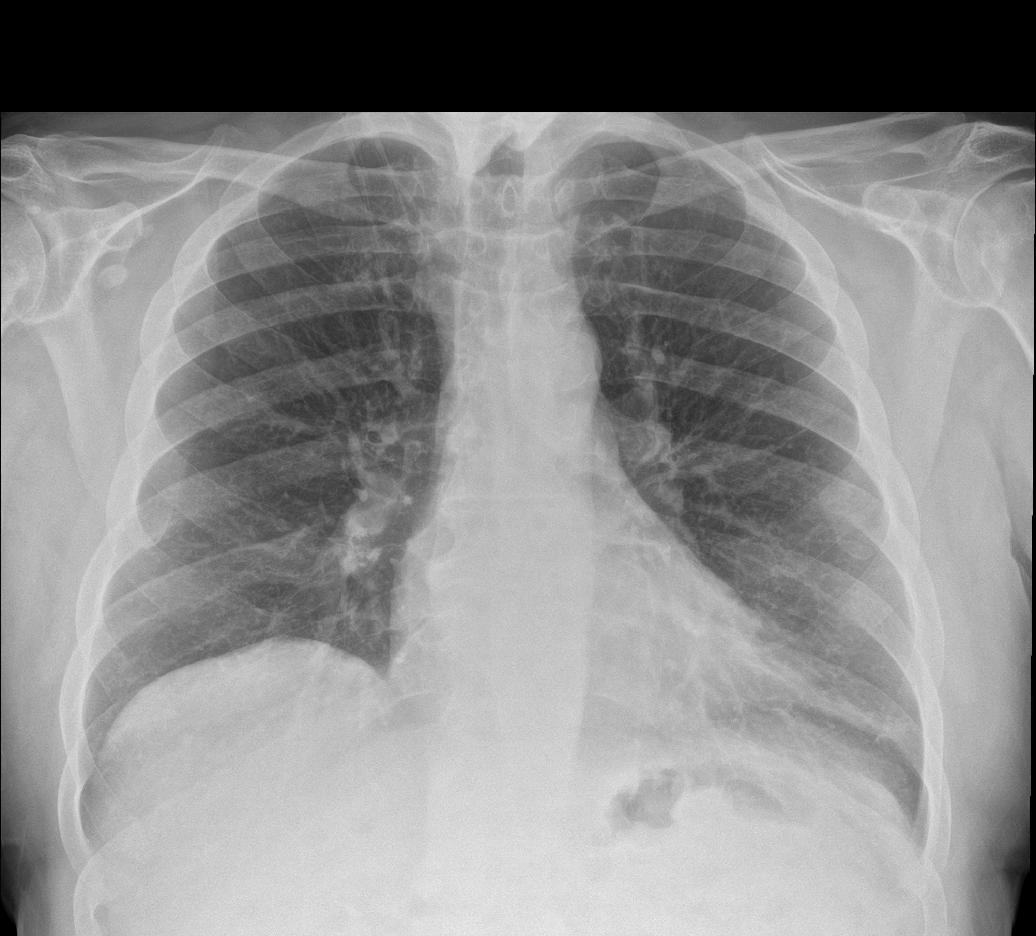

[rib pa obl (1 of 2)]
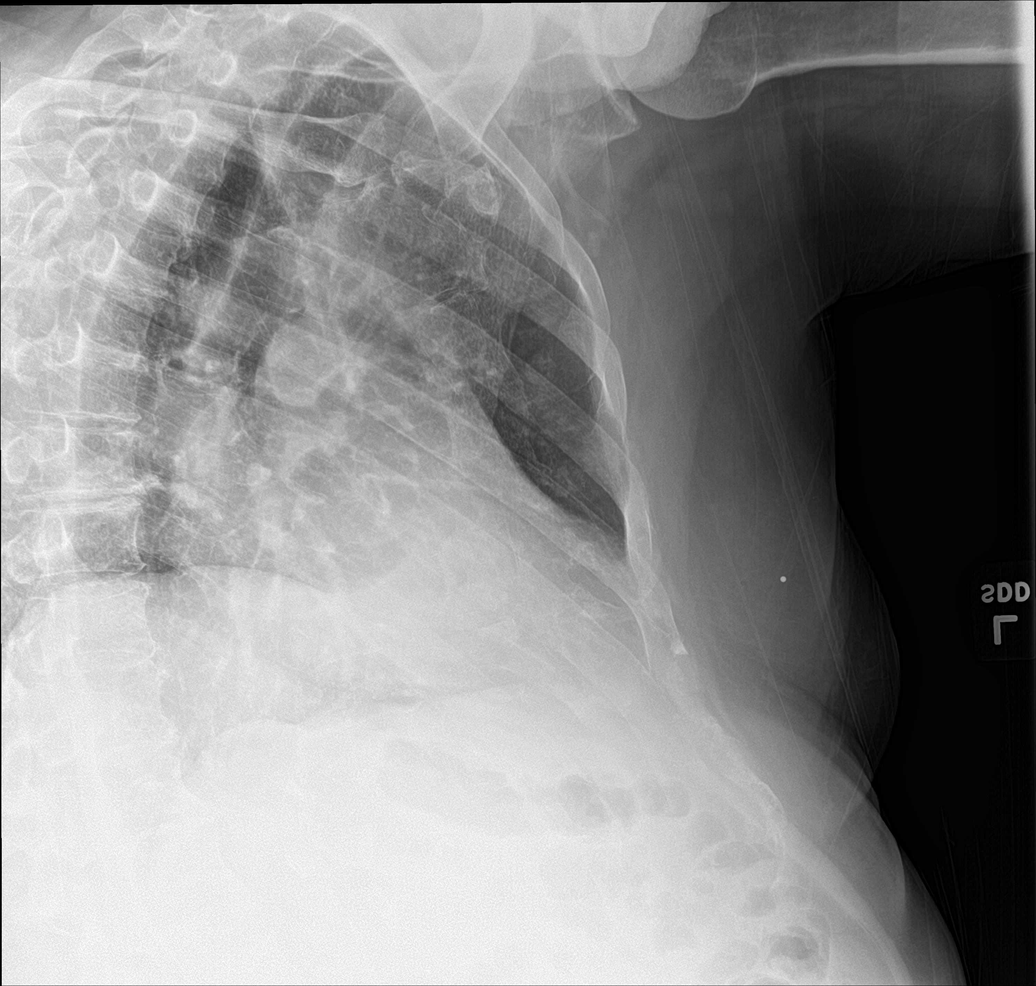

[rib pa obl (2 of 2)]
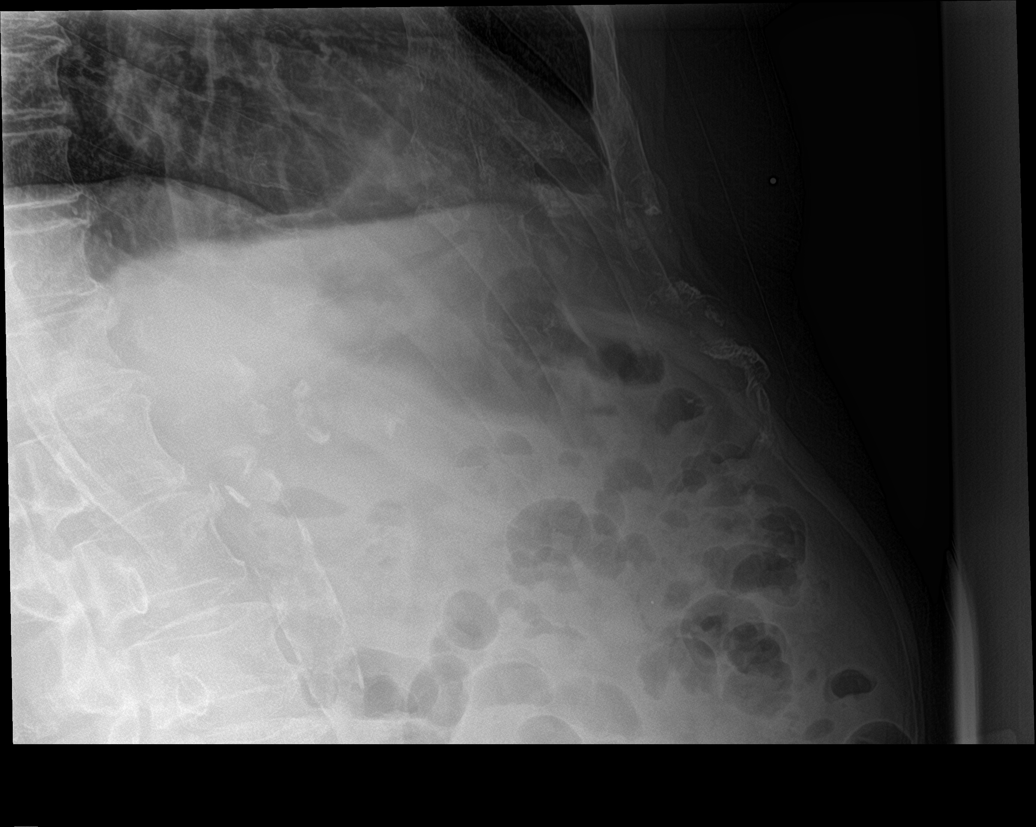

[rib pa (1 of 2)]
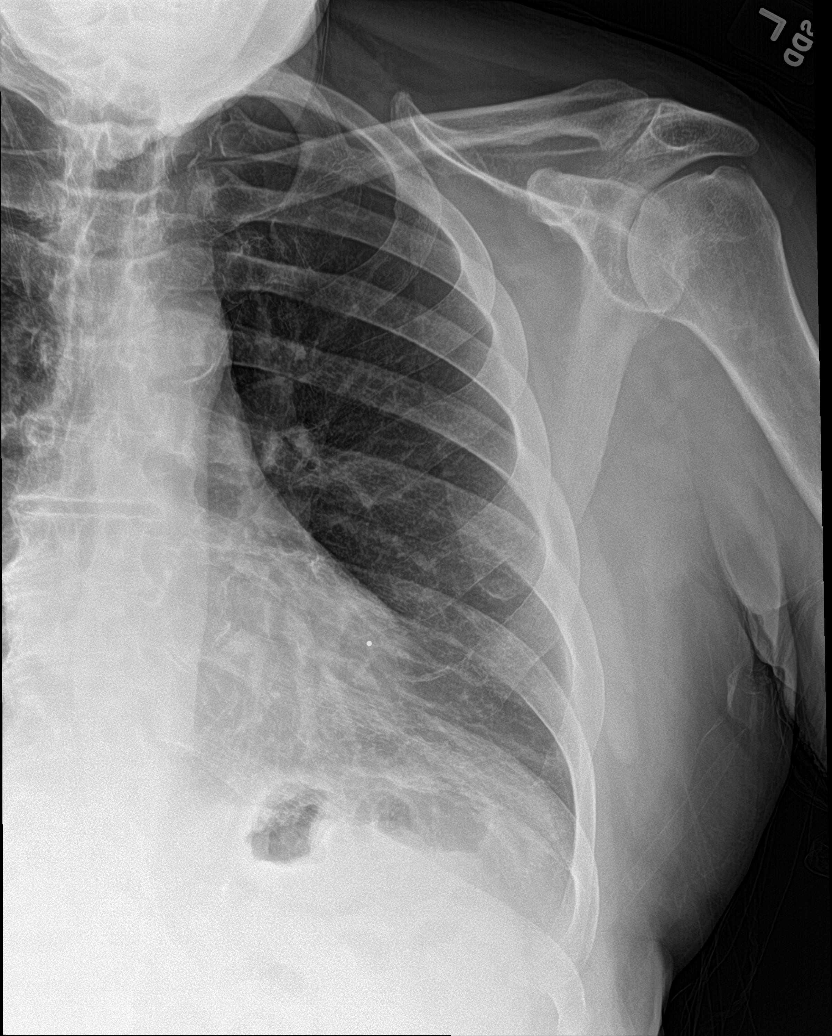

[rib pa (2 of 2)]
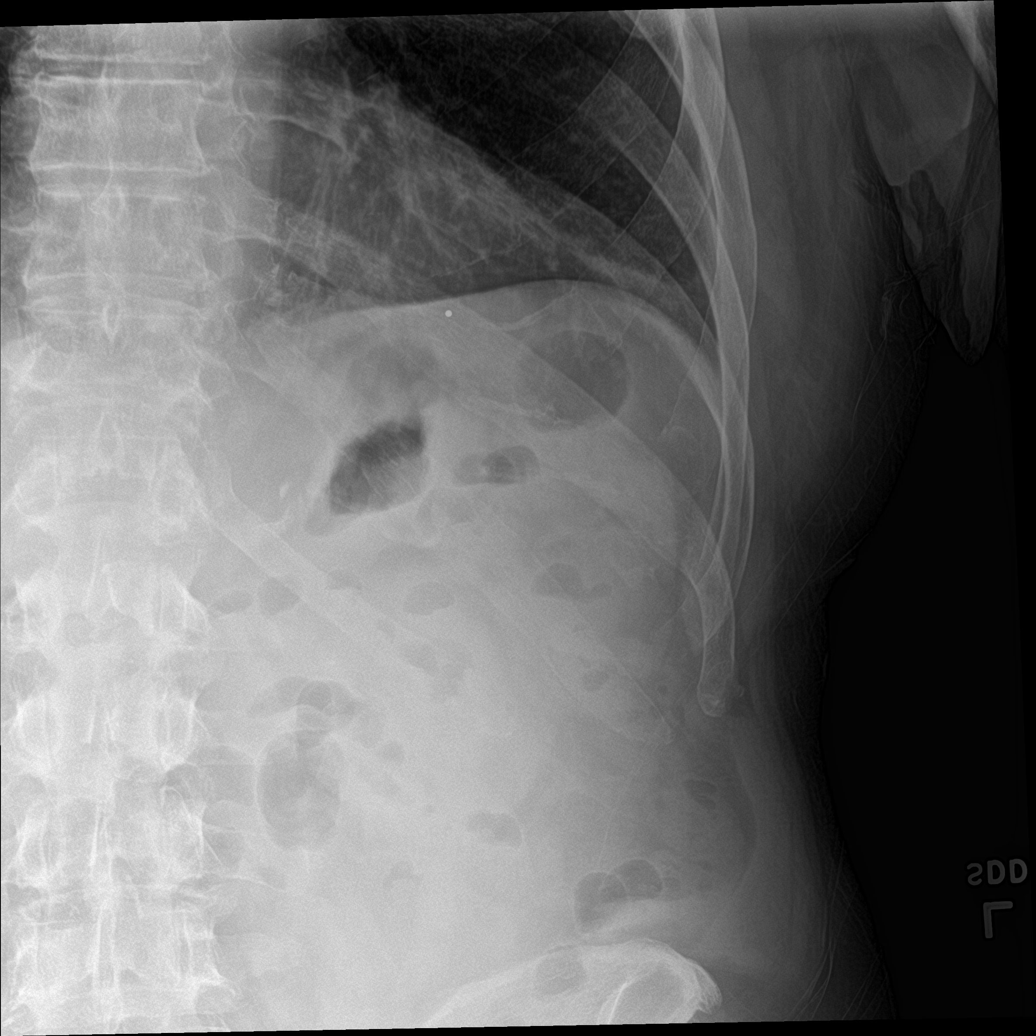

[5 of 5 positions shown; findings below may reference images not displayed]

FINDINGS: The lungs are clear. No pneumothorax or pleural effusion. Heart size
is normal. No fracture or other bony abnormality is identified.
IMPRESSION: Negative for fracture.  Negative exam.

## 2019-12-12 IMAGING — US US CAROTID DUPLEX BILAT
1 series · 13 of 24 positions shown · non-contrast
Comparison: None.

CLINICAL DATA: 77-year-old male with a four-month history of
dizziness

EXAM:
BILATERAL CAROTID DUPLEX ULTRASOUND
TECHNIQUE: Gray scale imaging, color Doppler and duplex ultrasound were
performed of bilateral carotid and vertebral arteries in the neck.

[Series 1: us carotid duplex bilat · 13 of 70 slices shown]
[im 1/70]
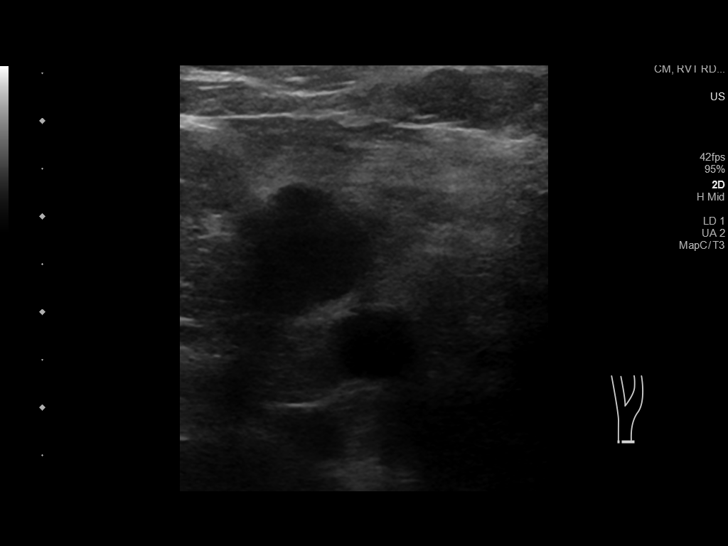
[im 7/70]
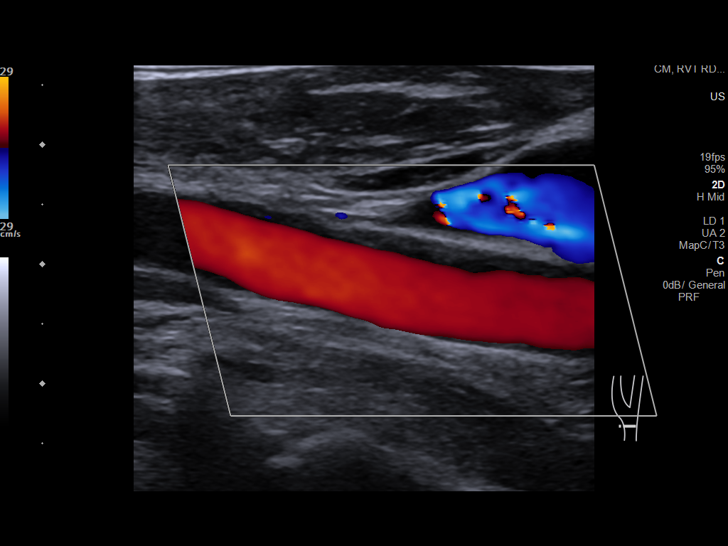
[im 13/70]
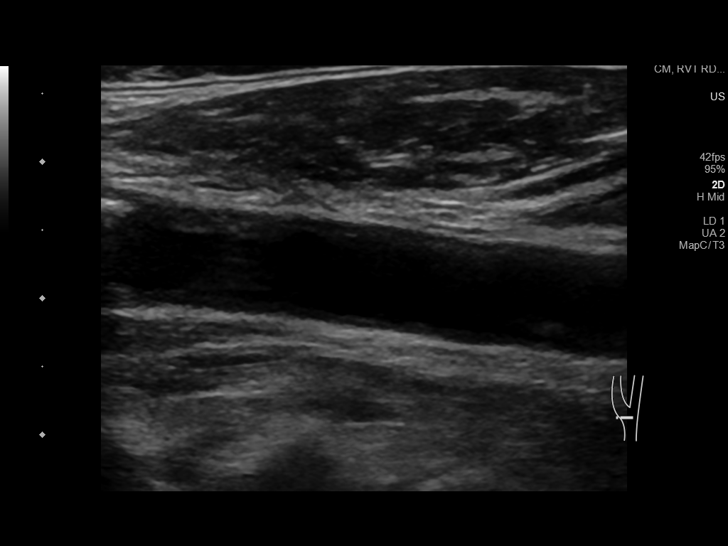
[im 19/70]
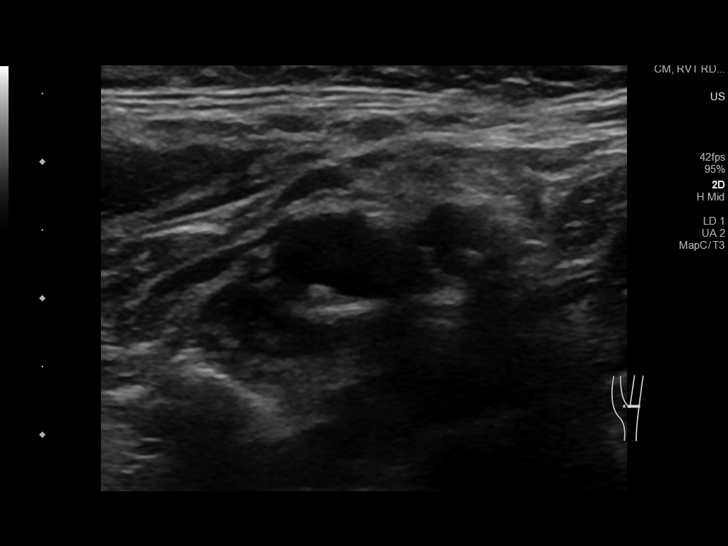
[im 25/70]
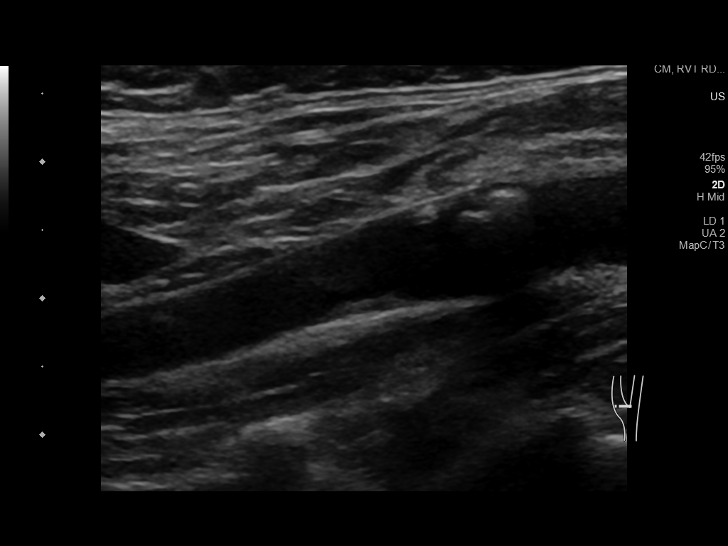
[im 31/70]
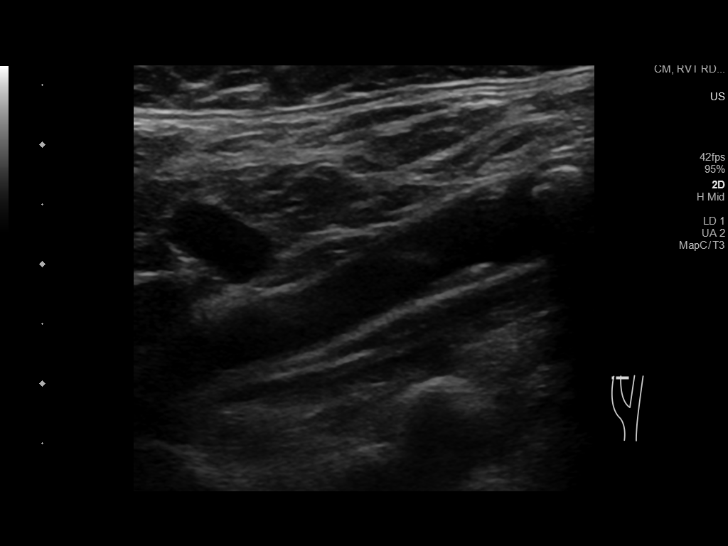
[im 37/70]
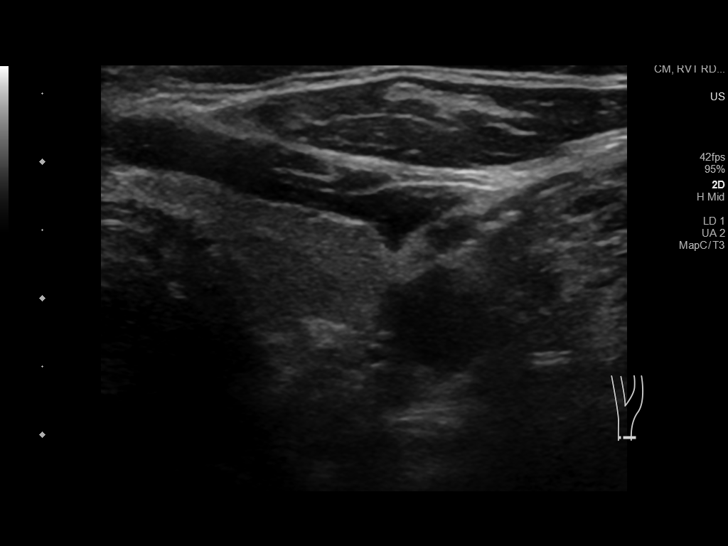
[im 40/70]
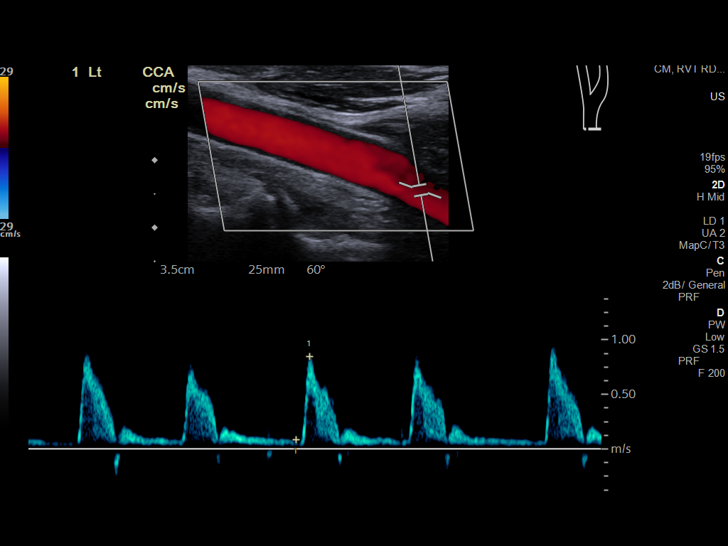
[im 46/70]
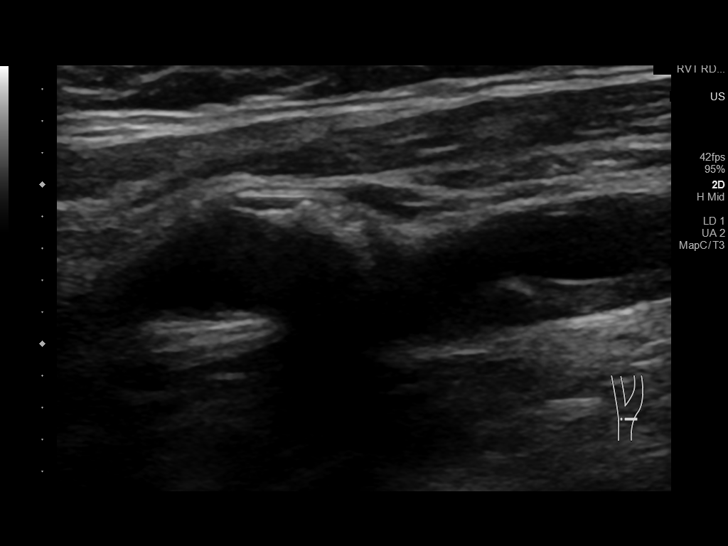
[im 52/70]
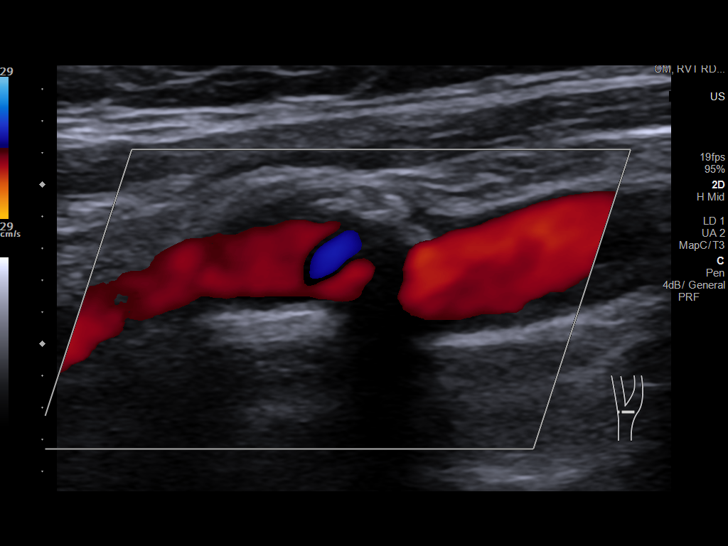
[im 58/70]
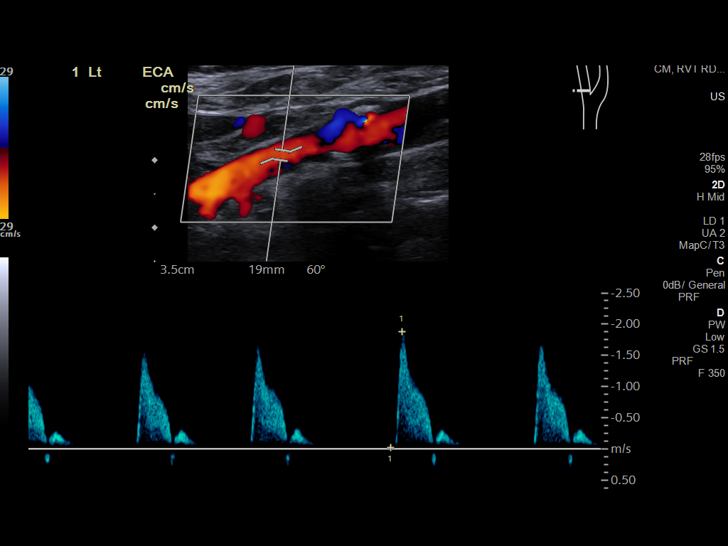
[im 64/70]
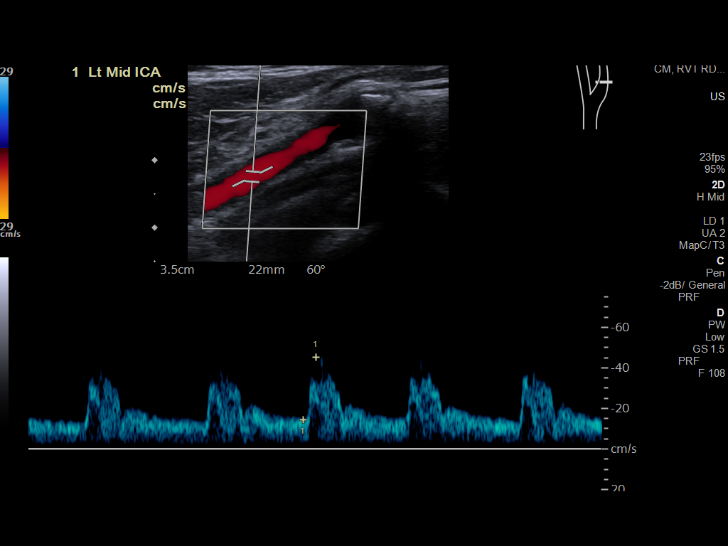
[im 70/70]
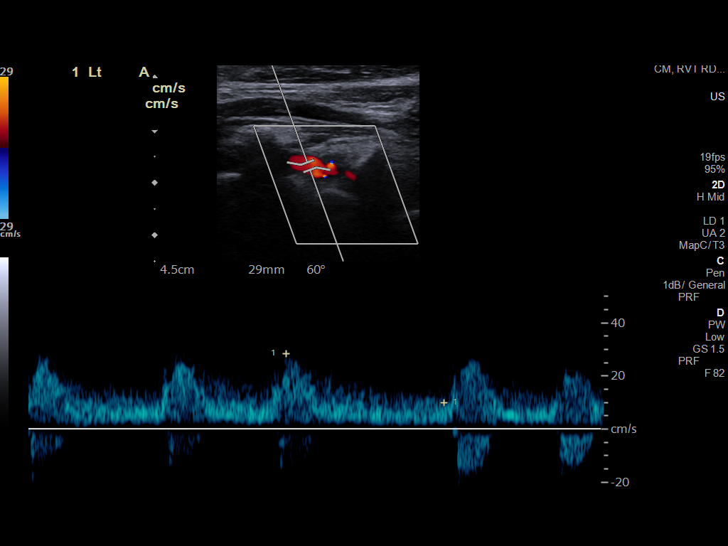

[13 of 24 positions shown; findings below may reference images not displayed]

FINDINGS: Criteria: Quantification of carotid stenosis is based on velocity
parameters that correlate the residual internal carotid diameter
with NASCET-based stenosis levels, using the diameter of the distal
internal carotid lumen as the denominator for stenosis measurement.

The following velocity measurements were obtained:

RIGHT
ICA: 102/20 cm/sec
CCA: 98/17 cm/sec

SYSTOLIC ICA/CCA RATIO:

ECA:  238 cm/sec

LEFT

ICA: 78/9 cm/sec

CCA: 93/11 cm/sec

SYSTOLIC ICA/CCA RATIO:

ECA:  188 cm/sec

RIGHT CAROTID ARTERY: Heterogeneous atherosclerotic plaque in the
distal common carotid artery extending into the proximal internal
and external carotid artery. By peak systolic velocity criteria, the
internal carotid artery stenosis remains less than 50%. However,
there is a greater than 60% stenosis in the external carotid artery
origin.

RIGHT VERTEBRAL ARTERY:  Patent with normal antegrade flow.

LEFT CAROTID ARTERY: Heterogeneous atherosclerotic plaque in the
distal common carotid artery extending through the bifurcation into
the proximal internal and external carotid artery. By peak systolic
velocity criteria, the estimated stenosis in the proximal internal
carotid artery is less than 50%. There is more significant elevation
of the peak systolic velocity in the external carotid artery
consistent with a 60% diameter stenosis.

LEFT VERTEBRAL ARTERY:  Patent with normal antegrade flow.
IMPRESSION: 1. Mild (1-49%) stenosis proximal right internal carotid artery
secondary to heterogenous atherosclerotic plaque.
2. Mild (1-49%) stenosis proximal left internal carotid artery
secondary to heterogenous atherosclerotic plaque.
3. Bilateral external carotid artery stenoses noted incidentally.
4. Vertebral arteries are patent with normal antegrade flow.

## 2020-02-04 DIAGNOSIS — Z299 Encounter for prophylactic measures, unspecified: Secondary | ICD-10-CM | POA: Diagnosis not present

## 2020-02-04 DIAGNOSIS — R350 Frequency of micturition: Secondary | ICD-10-CM | POA: Diagnosis not present

## 2020-02-04 DIAGNOSIS — L98499 Non-pressure chronic ulcer of skin of other sites with unspecified severity: Secondary | ICD-10-CM | POA: Diagnosis not present

## 2020-02-04 DIAGNOSIS — Z87891 Personal history of nicotine dependence: Secondary | ICD-10-CM | POA: Diagnosis not present

## 2020-02-04 DIAGNOSIS — N390 Urinary tract infection, site not specified: Secondary | ICD-10-CM | POA: Diagnosis not present

## 2020-02-04 DIAGNOSIS — I10 Essential (primary) hypertension: Secondary | ICD-10-CM | POA: Diagnosis not present

## 2020-04-08 DIAGNOSIS — I5032 Chronic diastolic (congestive) heart failure: Secondary | ICD-10-CM | POA: Diagnosis not present

## 2020-04-08 DIAGNOSIS — E1165 Type 2 diabetes mellitus with hyperglycemia: Secondary | ICD-10-CM | POA: Diagnosis not present

## 2020-04-08 DIAGNOSIS — M545 Low back pain, unspecified: Secondary | ICD-10-CM | POA: Diagnosis not present

## 2020-04-08 DIAGNOSIS — Z299 Encounter for prophylactic measures, unspecified: Secondary | ICD-10-CM | POA: Diagnosis not present

## 2020-05-05 DIAGNOSIS — I4891 Unspecified atrial fibrillation: Secondary | ICD-10-CM | POA: Diagnosis not present

## 2020-05-05 DIAGNOSIS — R079 Chest pain, unspecified: Secondary | ICD-10-CM | POA: Diagnosis not present

## 2020-05-05 DIAGNOSIS — J069 Acute upper respiratory infection, unspecified: Secondary | ICD-10-CM | POA: Diagnosis not present

## 2020-05-05 DIAGNOSIS — Z299 Encounter for prophylactic measures, unspecified: Secondary | ICD-10-CM | POA: Diagnosis not present

## 2020-05-05 DIAGNOSIS — I10 Essential (primary) hypertension: Secondary | ICD-10-CM | POA: Diagnosis not present

## 2020-09-13 DIAGNOSIS — E1165 Type 2 diabetes mellitus with hyperglycemia: Secondary | ICD-10-CM | POA: Diagnosis not present

## 2020-09-13 DIAGNOSIS — I4891 Unspecified atrial fibrillation: Secondary | ICD-10-CM | POA: Diagnosis not present

## 2020-09-13 DIAGNOSIS — Z299 Encounter for prophylactic measures, unspecified: Secondary | ICD-10-CM | POA: Diagnosis not present

## 2020-09-13 DIAGNOSIS — I10 Essential (primary) hypertension: Secondary | ICD-10-CM | POA: Diagnosis not present

## 2020-10-26 DIAGNOSIS — Z299 Encounter for prophylactic measures, unspecified: Secondary | ICD-10-CM | POA: Diagnosis not present

## 2020-10-26 DIAGNOSIS — I5032 Chronic diastolic (congestive) heart failure: Secondary | ICD-10-CM | POA: Diagnosis not present

## 2020-10-30 DEATH — deceased
